# Patient Record
Sex: Female | Born: 1960
Health system: Southern US, Community
[De-identification: ages and names within clinical notes are randomized; demographics above are authoritative.]

## PROBLEM LIST (undated history)

## (undated) DIAGNOSIS — E785 Hyperlipidemia, unspecified: Secondary | ICD-10-CM

## (undated) DIAGNOSIS — T7840XA Allergy, unspecified, initial encounter: Secondary | ICD-10-CM

## (undated) DIAGNOSIS — D571 Sickle-cell disease without crisis: Secondary | ICD-10-CM

## (undated) DIAGNOSIS — I10 Essential (primary) hypertension: Secondary | ICD-10-CM

## (undated) DIAGNOSIS — N9069 Other specified hypertrophy of vulva: Secondary | ICD-10-CM

## (undated) DIAGNOSIS — D3910 Neoplasm of uncertain behavior of unspecified ovary: Secondary | ICD-10-CM

## (undated) DIAGNOSIS — F419 Anxiety disorder, unspecified: Secondary | ICD-10-CM

## (undated) HISTORY — DX: Anxiety disorder, unspecified: F41.9

## (undated) HISTORY — DX: Allergy, unspecified, initial encounter: T78.40XA

## (undated) HISTORY — DX: Sickle-cell disease without crisis: D57.1

## (undated) HISTORY — DX: Essential (primary) hypertension: I10

## (undated) HISTORY — DX: Hyperlipidemia, unspecified: E78.5

## (undated) HISTORY — DX: Neoplasm of uncertain behavior of unspecified ovary: D39.10

## (undated) HISTORY — DX: Other specified hypertrophy of vulva: N90.69

---

## 1998-11-04 ENCOUNTER — Other Ambulatory Visit: Admission: RE | Admit: 1998-11-04 | Discharge: 1998-11-04 | Payer: Self-pay | Admitting: Obstetrics and Gynecology

## 1999-11-15 ENCOUNTER — Other Ambulatory Visit: Admission: RE | Admit: 1999-11-15 | Discharge: 1999-11-15 | Payer: Self-pay | Admitting: Obstetrics and Gynecology

## 2000-01-24 ENCOUNTER — Encounter: Payer: Self-pay | Admitting: Internal Medicine

## 2000-01-24 ENCOUNTER — Ambulatory Visit (HOSPITAL_COMMUNITY): Admission: RE | Admit: 2000-01-24 | Discharge: 2000-01-24 | Payer: Self-pay | Admitting: Internal Medicine

## 2000-12-16 ENCOUNTER — Other Ambulatory Visit: Admission: RE | Admit: 2000-12-16 | Discharge: 2000-12-16 | Payer: Self-pay | Admitting: Obstetrics and Gynecology

## 2002-05-20 ENCOUNTER — Other Ambulatory Visit: Admission: RE | Admit: 2002-05-20 | Discharge: 2002-05-20 | Payer: Self-pay | Admitting: Obstetrics and Gynecology

## 2003-06-11 ENCOUNTER — Other Ambulatory Visit: Admission: RE | Admit: 2003-06-11 | Discharge: 2003-06-11 | Payer: Self-pay | Admitting: Obstetrics and Gynecology

## 2004-06-13 ENCOUNTER — Other Ambulatory Visit: Admission: RE | Admit: 2004-06-13 | Discharge: 2004-06-13 | Payer: Self-pay | Admitting: Obstetrics and Gynecology

## 2005-07-03 ENCOUNTER — Other Ambulatory Visit: Admission: RE | Admit: 2005-07-03 | Discharge: 2005-07-03 | Payer: Self-pay | Admitting: Obstetrics and Gynecology

## 2006-08-13 HISTORY — PX: FOOT SURGERY: SHX648

## 2006-09-12 ENCOUNTER — Other Ambulatory Visit: Admission: RE | Admit: 2006-09-12 | Discharge: 2006-09-12 | Payer: Self-pay | Admitting: Obstetrics and Gynecology

## 2007-10-27 ENCOUNTER — Other Ambulatory Visit: Admission: RE | Admit: 2007-10-27 | Discharge: 2007-10-27 | Payer: Self-pay | Admitting: Obstetrics and Gynecology

## 2009-08-16 ENCOUNTER — Ambulatory Visit (HOSPITAL_COMMUNITY): Admission: RE | Admit: 2009-08-16 | Discharge: 2009-08-16 | Payer: Self-pay | Admitting: Obstetrics and Gynecology

## 2009-08-16 DIAGNOSIS — D3910 Neoplasm of uncertain behavior of unspecified ovary: Secondary | ICD-10-CM

## 2009-08-16 HISTORY — PX: LAPAROSCOPIC HYSTERECTOMY: SHX1926

## 2009-08-16 HISTORY — PX: ABDOMINAL HYSTERECTOMY: SHX81

## 2009-08-16 HISTORY — DX: Neoplasm of uncertain behavior of unspecified ovary: D39.10

## 2009-09-14 ENCOUNTER — Ambulatory Visit: Admission: RE | Admit: 2009-09-14 | Discharge: 2009-09-14 | Payer: Self-pay | Admitting: Gynecologic Oncology

## 2010-09-20 ENCOUNTER — Other Ambulatory Visit: Payer: Self-pay | Admitting: Gynecologic Oncology

## 2010-09-20 ENCOUNTER — Ambulatory Visit: Payer: 59 | Attending: Gynecologic Oncology | Admitting: Gynecologic Oncology

## 2010-09-20 DIAGNOSIS — D3910 Neoplasm of uncertain behavior of unspecified ovary: Secondary | ICD-10-CM | POA: Insufficient documentation

## 2010-09-20 DIAGNOSIS — G893 Neoplasm related pain (acute) (chronic): Secondary | ICD-10-CM

## 2010-09-20 DIAGNOSIS — Z90710 Acquired absence of both cervix and uterus: Secondary | ICD-10-CM | POA: Insufficient documentation

## 2010-09-20 DIAGNOSIS — I10 Essential (primary) hypertension: Secondary | ICD-10-CM | POA: Insufficient documentation

## 2010-09-20 DIAGNOSIS — Z09 Encounter for follow-up examination after completed treatment for conditions other than malignant neoplasm: Secondary | ICD-10-CM | POA: Insufficient documentation

## 2010-09-28 ENCOUNTER — Other Ambulatory Visit: Payer: Self-pay | Admitting: Gynecologic Oncology

## 2010-09-28 ENCOUNTER — Ambulatory Visit (HOSPITAL_COMMUNITY)
Admission: RE | Admit: 2010-09-28 | Discharge: 2010-09-28 | Disposition: A | Discharge status: Home / Self Care | Payer: 59 | Source: Ambulatory Visit | Attending: Gynecologic Oncology | Admitting: Gynecologic Oncology

## 2010-09-28 DIAGNOSIS — G893 Neoplasm related pain (acute) (chronic): Secondary | ICD-10-CM

## 2010-09-28 DIAGNOSIS — N838 Other noninflammatory disorders of ovary, fallopian tube and broad ligament: Secondary | ICD-10-CM | POA: Insufficient documentation

## 2010-09-28 DIAGNOSIS — Z90710 Acquired absence of both cervix and uterus: Secondary | ICD-10-CM | POA: Insufficient documentation

## 2010-09-28 DIAGNOSIS — Z9079 Acquired absence of other genital organ(s): Secondary | ICD-10-CM | POA: Insufficient documentation

## 2010-10-29 LAB — COMPREHENSIVE METABOLIC PANEL
ALT: 16 U/L (ref 0–35)
AST: 24 U/L (ref 0–37)
Albumin: 3.7 g/dL (ref 3.5–5.2)
Alkaline Phosphatase: 67 U/L (ref 39–117)
BUN: 13 mg/dL (ref 6–23)
CO2: 28 mEq/L (ref 19–32)
Calcium: 9.2 mg/dL (ref 8.4–10.5)
Chloride: 105 mEq/L (ref 96–112)
Creatinine, Ser: 0.89 mg/dL (ref 0.4–1.2)
GFR calc Af Amer: >60 mL/min (ref >60)
GFR calc non Af Amer: >60 mL/min (ref >60)
Glucose, Bld: 102 mg/dL — ABNORMAL HIGH (ref 70–99)
Potassium: 3.7 mEq/L (ref 3.5–5.1)
Sodium: 138 mEq/L (ref 135–145)
Total Bilirubin: 0.7 mg/dL (ref 0.3–1.2)
Total Protein: 6.8 g/dL (ref 6.0–8.3)

## 2010-10-29 LAB — CBC
HCT: 37.5 % (ref 36.0–46.0)
Hemoglobin: 12.4 g/dL (ref 12.0–15.0)
MCHC: 33.1 g/dL (ref 30.0–36.0)
MCV: 89.6 fL (ref 78.0–100.0)
Platelets: 263 10*3/uL (ref 150–400)
RBC: 4.18 MIL/uL (ref 3.87–5.11)
RDW: 13.4 % (ref 11.5–15.5)
WBC: 5.4 10*3/uL (ref 4.0–10.5)

## 2010-10-29 LAB — HCG, SERUM, QUALITATIVE: Preg, Serum: NEGATIVE

## 2010-11-03 NOTE — Consult Note (Signed)
NAMEELEESHA, PURKEY                ACCOUNT NO.:  [id number]  MEDICAL RECORD NO.:  [id number]           PATIENT TYPE:  LOCATION:                                 FACILITY:  PHYSICIAN:  Ridhaan Dreibelbis A. 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  She underwent in January 2011 a robotic-assisted hysterectomy, right salpingo-oophorectomy, and lysis of adhesions.  In the right adnexa, she had a 3 cm cyst.  When the ovary was opened, chocolate fluid consistent with an endometrioma extruded from it.  Left ovary was otherwise unremarkable, though adherent to the left pelvic sidewall.  Pathology was consistent with a low malignant potential tumor of the ovary.  This pathology was reviewed by Dr. Maple Hudson at [facility].  I last saw her on September 14, 2009.  Discussion at that time surrounded removing the other ovary versus conservative followup.  She opted for conservative followup and our plan was that she would see Dr. Tresa Res in the summer time and have an ultrasound.  She states she did see Dr. Tresa Res and does not recall having an ultrasound.  She is overall doing quite well.  She has had hot flashes for the past 2 months.  She is taking black cohosh with significant benefit.  She had some night sweats, but those are completely gone again with the black cohosh.  She denies any pain, any change in bowel or bladder habits.  She does use FiberCon p.r.n. Occasionally, she will need to use MiraLax.  She denies any blood in her stool.  We had a discussion that regarding her desire to keep the ovary was surrounding the wish to not be menopausal, but now that she is menopausal, does she wish to have the left ovary removed, she states she does not and she would also like to  avoid surgery, but if there is any abnormality in the left ovary, she will be willing to reconsider.  She was told by Dr. Tresa Res in September that her cholesterol was elevated, she is not sure what the value was.  She has a history of high cholesterol and was on Lipitor in the past and came off it once her cholesterol normalized.  She was to follow up with her primary care physician, which she has not done.  PAST MEDICAL HISTORY: 1. Hypertension. 2. Asthma. 3. Allergies 4. Sickle cell trait. 5. Hypercholesterolemia.  MEDICATIONS: 1. Nariz. 2. Singulair. 3. Xyzal. 4. Advil. 5. Exforge. 6. Calcium. 7. Vitamin D. 8. Multivitamin. 9. Glucosamine. 10.Deplin. 11.Paxil.  ALLERGIES:  None.  HEALTH MAINTENANCE:  She is up-to-date on her mammogram.  She is due for a colonoscopy soon.  PHYSICAL EXAMINATION:  VITAL SIGNS:  Weight 121 pounds, height 5 feet 4 inches, blood pressure 156/90, respirations 16, pulse 68. GENERAL:  Well-nourished and well-developed female in no acute distress. NECK:  Supple.  There is no  lymphadenopathy.  No thyromegaly. LUNGS:  Clear to auscultation bilaterally. CARDIOVASCULAR:  Regular rate and rhythm. ABDOMEN:  Well-healed surgical incisions.  Abdomen is soft, nontender, nondistended.  There are no palpable masses or hepatosplenomegaly.  Exam is somewhat limited by voluntary guarding. LYMPHATIC:  Groins are negative for adenopathy. EXTREMITIES:  No edema. PELVIC:  External genitalia within normal limits.  Bimanual examination reveals no masses or nodularity. RECTAL:  Confirms.  ASSESSMENT AND PLAN: 1. A 50 year old with a stage IC low malignant potential tumor of the     ovary, who still has her left ovary in situ.  It does not appear     that she has had an ultrasound.  We will schedule her for an     ultrasound today to evaluate the left ovary, and I will contact her     with the results. 2. She was encouraged to see her primary care physician  regarding her     cholesterol level and need for screening colonoscopy. 3. She will see Dr. Tresa Res in 6 months and return to see [place] in 1 year     or p.r.n.     Hersh Minney A. Duard Brady, MD     PAG/MEDQ  D:  09/20/2010  T:  09/20/2010  Job:  [id number]  cc:   Margaretmary Bayley, M.D. Fax: [phone]  Telford Nab, R.N. [address] [place], [place] [address]  Edwena Felty. Romine, M.D. Fax: [phone]  Electronically Signed by Cleda Mccreedy MD on 09/22/2010 10:19:34 AM

## 2011-01-20 ENCOUNTER — Encounter: Payer: Self-pay | Admitting: Gastroenterology

## 2011-08-17 ENCOUNTER — Other Ambulatory Visit: Payer: Self-pay | Admitting: Internal Medicine

## 2011-08-17 ENCOUNTER — Ambulatory Visit (HOSPITAL_COMMUNITY)
Admission: RE | Admit: 2011-08-17 | Discharge: 2011-08-17 | Disposition: A | Discharge status: Home / Self Care | Payer: 59 | Source: Ambulatory Visit | Attending: Internal Medicine | Admitting: Internal Medicine

## 2011-08-17 DIAGNOSIS — M4120 Other idiopathic scoliosis, site unspecified: Secondary | ICD-10-CM | POA: Insufficient documentation

## 2011-08-17 DIAGNOSIS — R52 Pain, unspecified: Secondary | ICD-10-CM

## 2011-08-17 DIAGNOSIS — M545 Low back pain, unspecified: Secondary | ICD-10-CM | POA: Insufficient documentation

## 2011-08-17 DIAGNOSIS — M25559 Pain in unspecified hip: Secondary | ICD-10-CM | POA: Insufficient documentation

## 2011-09-06 ENCOUNTER — Encounter: Payer: Self-pay | Admitting: Gastroenterology

## 2011-09-10 ENCOUNTER — Ambulatory Visit (AMBULATORY_SURGERY_CENTER): Payer: 59 | Admitting: *Deleted

## 2011-09-10 VITALS — Ht 64.0 in | Wt 185.0 lb

## 2011-09-10 DIAGNOSIS — Z1211 Encounter for screening for malignant neoplasm of colon: Secondary | ICD-10-CM

## 2011-09-10 MED ORDER — PEG-KCL-NACL-NASULF-NA ASC-C 100 G PO SOLR: ORAL | Status: DC

## 2011-09-11 ENCOUNTER — Encounter: Payer: Self-pay | Admitting: Gastroenterology

## 2011-09-24 ENCOUNTER — Encounter: Payer: Self-pay | Admitting: Gastroenterology

## 2011-09-24 ENCOUNTER — Ambulatory Visit (AMBULATORY_SURGERY_CENTER): Payer: 59 | Admitting: Gastroenterology

## 2011-09-24 DIAGNOSIS — D128 Benign neoplasm of rectum: Secondary | ICD-10-CM

## 2011-09-24 DIAGNOSIS — D126 Benign neoplasm of colon, unspecified: Secondary | ICD-10-CM

## 2011-09-24 DIAGNOSIS — D129 Benign neoplasm of anus and anal canal: Secondary | ICD-10-CM

## 2011-09-24 DIAGNOSIS — K5730 Diverticulosis of large intestine without perforation or abscess without bleeding: Secondary | ICD-10-CM

## 2011-09-24 DIAGNOSIS — Z1211 Encounter for screening for malignant neoplasm of colon: Secondary | ICD-10-CM

## 2011-09-24 MED ORDER — SODIUM CHLORIDE 0.9 % IV SOLN: 500.0000 mL | INTRAVENOUS | Status: DC

## 2011-09-24 NOTE — Op Note (Signed)
[facility] [address] [place], [place]  [address]  COLONOSCOPY PROCEDURE REPORT  PATIENT:  Tammy King, Tammy King  MR#:  [id number] BIRTHDATE:  07-07-1961, 50 yrs. old  GENDER:  female ENDOSCOPIST:  Barbette Hair. Arlyce Dice, MD REF. BY:  Margaretmary Bayley, M.D. PROCEDURE DATE:  09/24/2011 PROCEDURE:  Colonoscopy with snare polypectomy ASA CLASS:  Class II INDICATIONS:  Routine Risk Screening MEDICATIONS:   MAC sedation, administered by CRNA propofol 250mg IV  DESCRIPTION OF PROCEDURE:   After the risks benefits and alternatives of the procedure were thoroughly explained, informed consent was obtained.  Digital rectal exam was performed and revealed no rectal masses.   The LB CF-[id number] endoscope was introduced through the anus and advanced to the cecum, which was identified by both the appendix and ileocecal valve, without limitations.  The quality of the prep was excellent, using MoviPrep.  The instrument was then slowly withdrawn as the colon was fully examined. <<PROCEDUREIMAGES>>  FINDINGS:  A sessile polyp was found. It was 4 mm in size. It was found 30 cm from the point of entry. Polyp was snared without cautery. Retrieval was successful (see image4). snare polyp Moderate diverticulosis was found in the sigmoid colon (see image3).  Scattered diverticula were found. descending to ascending colon  This was otherwise a normal examination of the colon (see image2 and image7).   Retroflexed views in the rectum revealed no abnormalities.    The time to cecum =  1) 4.75 minutes. The scope was then withdrawn in  1) 10.0  minutes from the cecum and the procedure completed. COMPLICATIONS:  None ENDOSCOPIC IMPRESSION: 1) 4 mm sessile polyp 2) Moderate diverticulosis in the sigmoid colon 3) Diverticula, scattered 4) Otherwise normal examination RECOMMENDATIONS: 1) If the polyp(s) removed today are proven to be adenomatous (pre-cancerous) polyps, you will need a repeat colonoscopy  in 5 years. Otherwise you should continue to follow colorectal cancer screening guidelines for "routine risk" patients with colonoscopy in 10 years. REPEAT EXAM:  You will receive a letter from Dr. Arlyce Dice in 1-2 weeks, after reviewing the final pathology, with followup recommendations.  ______________________________ Barbette Hair Arlyce Dice, MD  CC:  n. eSIGNED:   Barbette Hair. Tammy King at 09/24/2011 09:13 AM  Christoper Fabian, [phone]

## 2011-09-24 NOTE — Patient Instructions (Addendum)
Colon Polyps A polyp is extra tissue that grows inside your body. Colon polyps grow in the large intestine. The large intestine, also called the colon, is part of your digestive system. It is a long, hollow tube at the end of your digestive tract where your body makes and stores stool. Most polyps are not dangerous. They are benign. This means they are not cancerous. But over time, some types of polyps can turn into cancer. Polyps that are smaller than a pea are usually not harmful. But larger polyps could someday become or may already be cancerous. To be safe, doctors remove all polyps and test them.  WHO GETS POLYPS? Anyone can get polyps, but certain people are more likely than others. You may have a greater chance of getting polyps if:  You are over 50.   You have had polyps before.   Someone in your family has had polyps.   Someone in your family has had cancer of the large intestine.   Find out if someone in your family has had polyps. You may also be more likely to get polyps if you:   Eat a lot of fatty foods.   Smoke.   Drink alcohol.   Do not exercise.   Eat too much.  SYMPTOMS  Most small polyps do not cause symptoms. People often do not know they have one until their caregiver finds it during a regular checkup or while testing them for something else. Some people do have symptoms like these:  Bleeding from the anus. You might notice blood on your underwear or on toilet paper after you have had a bowel movement.   Constipation or diarrhea that lasts more than a week.   Blood in the stool. Blood can make stool look black or it can show up as red streaks in the stool.  If you have any of these symptoms, see your caregiver. HOW DOES THE DOCTOR TEST FOR POLYPS? The doctor can use four tests to check for polyps:  Digital rectal exam. The caregiver wears gloves and checks your rectum (the last part of the large intestine) to see if it feels normal. This test would find  polyps only in the rectum. Your caregiver may need to do one of the other tests listed below to find polyps higher up in the intestine.   Barium enema. The caregiver puts a liquid called barium into your rectum before taking x-rays of your large intestine. Barium makes your intestine look white in the pictures. Polyps are dark, so they are easy to see.   Sigmoidoscopy. With this test, the caregiver can see inside your large intestine. A thin flexible tube is placed into your rectum. The device is called a sigmoidoscope, which has a light and a tiny video camera in it. The caregiver uses the sigmoidoscope to look at the last third of your large intestine.   Colonoscopy. This test is like sigmoidoscopy, but the caregiver looks at all of the large intestine. It usually requires sedation. This is the most common method for finding and removing polyps.  TREATMENT   The caregiver will remove the polyp during sigmoidoscopy or colonoscopy. The polyp is then tested for cancer.   If you have had polyps, your caregiver may want you to get tested regularly in the future.  PREVENTION  There is not one sure way to prevent polyps. You might be able to lower your risk of getting them if you:  Eat more fruits and vegetables and less fatty   food.   Do not smoke.   Avoid alcohol.   Exercise every day.   Lose weight if you are overweight.   Eating more calcium and folate can also lower your risk of getting polyps. Some foods that are rich in calcium are milk, cheese, and broccoli. Some foods that are rich in folate are chickpeas, kidney beans, and spinach.   Aspirin might help prevent polyps. Studies are under way.  Document Released: 04/25/2004 Document Revised: 04/11/2011 Document Reviewed: 10/01/2007 Exit[employer] Patient Information 2012 [employer].  Diverticulosis Diverticulosis is a common condition that develops when small pouches (diverticula) form in the wall of the colon. The risk of  diverticulosis increases with age. It happens more often in people who eat a low-fiber diet. Most individuals with diverticulosis have no symptoms. Those individuals with symptoms usually experience abdominal pain, constipation, or loose stools (diarrhea). HOME CARE INSTRUCTIONS   Increase the amount of fiber in your diet as directed by your caregiver or dietician. This may reduce symptoms of diverticulosis.   Your caregiver may recommend taking a dietary fiber supplement.   Drink at least 6 to 8 glasses of water each day to prevent constipation.   Try not to strain when you have a bowel movement.   Your caregiver may recommend avoiding nuts and seeds to prevent complications, although this is still an uncertain benefit.   Only take over-the-counter or prescription medicines for pain, discomfort, or fever as directed by your caregiver.  FOODS WITH HIGH FIBER CONTENT INCLUDE:  Fruits. Apple, peach, pear, tangerine, raisins, prunes.   Vegetables. Brussels sprouts, asparagus, broccoli, cabbage, carrot, cauliflower, romaine lettuce, spinach, summer squash, tomato, winter squash, zucchini.   Starchy Vegetables. Baked beans, kidney beans, lima beans, split peas, lentils, potatoes (with skin).   Grains. Whole wheat bread, brown rice, bran flake cereal, plain oatmeal, white rice, shredded wheat, bran muffins.  SEEK IMMEDIATE MEDICAL CARE IF:   You develop increasing pain or severe bloating.   You have an oral temperature above 102 F (38.9 C), not controlled by medicine.   You develop vomiting or bowel movements that are bloody or black.  Document Released: 04/26/2004 Document Revised: 04/11/2011 Document Reviewed: 12/28/2009 Exit[employer] Patient Information 2012 [employer]. Discharge instructions given with verbal understanding. Handouts on polyps and diverticulosis given. Resume previous medications. 

## 2011-09-24 NOTE — Progress Notes (Signed)
proopfol per [facility]. See scanned intra procedure report. ewm

## 2011-09-24 NOTE — Progress Notes (Signed)
Patient did not experience any of the following events: a burn prior to discharge; a fall within the facility; wrong site/side/patient/procedure/implant event; or a hospital transfer or hospital admission upon discharge from the facility. (G8907) Patient did not have preoperative order for IV antibiotic SSI prophylaxis. (G8918)  

## 2011-09-25 ENCOUNTER — Telehealth: Payer: Self-pay | Admitting: *Deleted

## 2011-09-25 NOTE — Telephone Encounter (Signed)
  Follow up Call-  Call back number 09/24/2011  Post procedure Call Back phone  # [phone]  Permission to leave phone message Yes     Patient questions:  Do you have a fever, pain , or abdominal swelling? no Pain Score  0 *  Have you tolerated food without any problems? yes  Have you been able to return to your normal activities? yes  Do you have any questions about your discharge instructions: Diet   no Medications  no Follow up visit  no  Do you have questions or concerns about your Care? yes  Actions: * If pain score is 4 or above: No action needed, pain <4.  Pt. Did not have any questions or concerns about her care, "yes' entered in error.

## 2011-09-28 ENCOUNTER — Encounter: Payer: Self-pay | Admitting: Gastroenterology

## 2011-11-29 ENCOUNTER — Ambulatory Visit: Payer: 59 | Attending: Gynecologic Oncology | Admitting: Gynecologic Oncology

## 2011-11-29 ENCOUNTER — Encounter: Payer: Self-pay | Admitting: Gynecologic Oncology

## 2011-11-29 VITALS — BP 120/78 | HR 82 | Temp 98.5°F | Resp 20 | Ht 64.0 in | Wt 177.5 lb

## 2011-11-29 DIAGNOSIS — D279 Benign neoplasm of unspecified ovary: Secondary | ICD-10-CM

## 2011-11-29 DIAGNOSIS — C569 Malignant neoplasm of unspecified ovary: Secondary | ICD-10-CM

## 2011-11-29 DIAGNOSIS — D3910 Neoplasm of uncertain behavior of unspecified ovary: Secondary | ICD-10-CM | POA: Insufficient documentation

## 2011-11-29 NOTE — Progress Notes (Signed)
Consult Note: Gyn-Onc  Tammy King 51 y.o. female  CC:  Chief Complaint  Patient presents with  . 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 She underwent in January 2011 a robotic-assisted hysterectomy, right  salpingo-oophorectomy, and lysis of adhesions. In the right adnexa, she had a 3 cm cyst. When the ovary was opened, chocolate fluid consistent with an endometrioma extruded from it. Left ovary was otherwise  unremarkable, though adherent to the left pelvic sidewall. Pathology was consistent with a low malignant potential tumor of the ovary. This pathology was reviewed by Dr. Maple Hudson at [facility]. I last saw her on September 14, 2009. Discussion at that time surrounded removing the other ovary versus conservative followup. She opted for conservative followup.  She states she did see Dr. Tresa Res.  She is overall doing quite well. She has  had hot flashes. She had some night sweats. She denies any pain, any change in bowel or bladder habits. She does use FiberCon p.r.n. occasionally, she will need to use MiraLax. She denies any blood in her  stool.   We had a discussion that regarding her desire to keep the ovary was surrounding the wish to not be menopausal, but now that she is perimenopausal, does she wish to have the left ovary removed.  She is considering having her ovary removed, but has not completely decided.  Interval History:  She had a colonoscopy, February 11 that had a small polyp was benign. Recommendations were for 10 year followup. She had a mammogram in the fall of 2012. There was an issue questionable with the right breast followup was negative and followup for that is one year. There are no new family history issues. Review of Systems 10 point review of systems is negative Current Meds:  Outpatient Encounter Prescriptions as of 11/29/2011    Medication Sig Dispense Refill  . atorvastatin (LIPITOR) 20 MG tablet Take 1 tablet by mouth Daily.      . AZOR 10-40 MG per tablet Take 1 tablet by mouth Daily.      . CELEBREX 200 MG capsule Take 1 capsule by mouth as needed. pain      . Cholecalciferol (VITAMIN D) 1000 UNITS capsule Take 1,000 Units by mouth daily as needed. Takes 2-3 times a week      . Cyanocobalamin (VITAMIN B 12 PO) Take 100 mcg by mouth daily.      Marland Kitchen desloratadine (CLARINEX) 5 MG tablet Take 5 mg by mouth daily.      . hydrochlorothiazide (HYDRODIURIL) 25 MG tablet Take 1 tablet by mouth Daily.      Marland Kitchen KLOR-CON M20 20 MEQ tablet Take 1 tablet by mouth Daily.      . montelukast (SINGULAIR) 10 MG tablet Take 1 tablet by mouth Daily.      . Multiple Vitamin (MULITIVITAMIN WITH MINERALS) TABS Take 1 tablet by mouth daily as needed. Takes about 2-3 a week      . Nutritional Supplements (ESTROVEN MAXIMUM STRENGTH) TABS Take 1 tablet by mouth daily.      Marland Kitchen PARoxetine (PAXIL) 20 MG tablet Take 20 mg by mouth every morning.        Allergy: No Known Allergies  Social Hx:   History   Social History  . Marital Status: Single    Spouse Name: N/A    Number of  Children: N/A  . Years of Education: N/A   Occupational History  . Not on file.   Social History Main Topics  . Smoking status: Never Smoker   . Smokeless tobacco: Never Used  . Alcohol Use: 1.2 oz/week    2 Glasses of wine per week  . Drug Use: No  . Sexually Active: Not on file   Other Topics Concern  . Not on file   Social History Narrative  . No narrative on file    Past Surgical Hx:  Past Surgical History  Procedure Date  . Abdominal hysterectomy 2011    Past Medical Hx:  Past Medical History  Diagnosis Date  . Allergy   . Anxiety   . Asthma   . Hyperlipidemia   . Hypertension   . Sickle cell anemia     trait    Family Hx:  Family History  Problem Relation Age of Onset  . Colon cancer Maternal Grandmother     Vitals:  Blood  pressure 120/78, pulse 82, temperature 98.5 F (36.9 C), temperature source Oral, resp. rate 20, height 5\' 4"  (1.626 m), weight 177 lb 8 oz (80.513 kg).  Physical Exam: GENERAL: Well-nourished and well-developed female in no acute distress.  NECK: Supple. There is no lymphadenopathy. No thyromegaly.  LUNGS: Clear to auscultation bilaterally.  CARDIOVASCULAR: Regular rate and rhythm.  ABDOMEN: Well-healed surgical incisions. Abdomen is soft, nontender, nondistended. There are no palpable masses or hepatosplenomegaly. Exam is somewhat limited by voluntary guarding.  LYMPHATIC: Groins are negative for adenopathy.  EXTREMITIES: No edema.  PELVIC: External genitalia within normal limits. Bimanual examination reveals no masses or nodularity.  RECTAL: Confirms.   Assessment/Plan: ASSESSMENT AND PLAN:  51. A 51 year old with a stage IC low malignant potential tumor of the ovary, who still has her left ovary in situ. It does not appear that she has had an ultrasound. We will schedule her for an ultrasound today to evaluate the left ovary, and I will contact her with the results.  2.  She will see Dr. Tresa Res in 6 months and return to see [place] in 1 year or p.r.n.  Travis Mastel A., MD 11/29/2011, 10:35 AM

## 2011-11-29 NOTE — Patient Instructions (Signed)
Please keep your ultrasound appointment I will followup on the results with you. See Dr. Tresa Res in 6 months and return to see [place] in one year

## 2011-12-06 ENCOUNTER — Ambulatory Visit (HOSPITAL_COMMUNITY)
Admission: RE | Admit: 2011-12-06 | Discharge: 2011-12-06 | Disposition: A | Discharge status: Home / Self Care | Payer: 59 | Source: Ambulatory Visit | Attending: Gynecologic Oncology | Admitting: Gynecologic Oncology

## 2011-12-06 DIAGNOSIS — C569 Malignant neoplasm of unspecified ovary: Secondary | ICD-10-CM

## 2011-12-06 DIAGNOSIS — Z9079 Acquired absence of other genital organ(s): Secondary | ICD-10-CM | POA: Insufficient documentation

## 2011-12-06 DIAGNOSIS — Z90710 Acquired absence of both cervix and uterus: Secondary | ICD-10-CM | POA: Insufficient documentation

## 2011-12-06 DIAGNOSIS — Z09 Encounter for follow-up examination after completed treatment for conditions other than malignant neoplasm: Secondary | ICD-10-CM | POA: Insufficient documentation

## 2011-12-10 ENCOUNTER — Telehealth: Payer: Self-pay | Admitting: Gynecologic Oncology

## 2011-12-10 NOTE — Telephone Encounter (Signed)
Message left for patient with ultrasound results: normal.  Instructed to call for any questions or concerns.

## 2013-01-21 ENCOUNTER — Other Ambulatory Visit: Payer: Self-pay | Admitting: Gynecologic Oncology

## 2013-01-21 ENCOUNTER — Telehealth: Payer: Self-pay | Admitting: *Deleted

## 2013-01-21 DIAGNOSIS — D270 Benign neoplasm of right ovary: Secondary | ICD-10-CM

## 2013-01-21 NOTE — Telephone Encounter (Signed)
Called to notify pt of scheduled U/S appointment. PT instructed to arrive at [facility] @ 1:45pm on June 30th for a 2:00 appt with a full bladder. Pt agreed with appointment time and date.

## 2013-02-09 ENCOUNTER — Ambulatory Visit (HOSPITAL_COMMUNITY)
Admission: RE | Admit: 2013-02-09 | Discharge: 2013-02-09 | Disposition: A | Discharge status: Home / Self Care | Payer: 59 | Source: Ambulatory Visit | Attending: Gynecologic Oncology | Admitting: Gynecologic Oncology

## 2013-02-09 DIAGNOSIS — Z90710 Acquired absence of both cervix and uterus: Secondary | ICD-10-CM | POA: Insufficient documentation

## 2013-02-09 DIAGNOSIS — D270 Benign neoplasm of right ovary: Secondary | ICD-10-CM

## 2013-02-09 DIAGNOSIS — Z09 Encounter for follow-up examination after completed treatment for conditions other than malignant neoplasm: Secondary | ICD-10-CM | POA: Insufficient documentation

## 2013-02-09 DIAGNOSIS — Z9079 Acquired absence of other genital organ(s): Secondary | ICD-10-CM | POA: Insufficient documentation

## 2013-02-19 ENCOUNTER — Ambulatory Visit: Payer: 59 | Attending: Gynecologic Oncology | Admitting: Gynecologic Oncology

## 2013-02-19 ENCOUNTER — Encounter: Payer: Self-pay | Admitting: Gynecologic Oncology

## 2013-02-19 VITALS — BP 110/80 | HR 70 | Temp 98.6°F | Resp 16 | Ht 64.0 in | Wt 173.1 lb

## 2013-02-19 DIAGNOSIS — C569 Malignant neoplasm of unspecified ovary: Secondary | ICD-10-CM

## 2013-02-19 NOTE — Patient Instructions (Signed)
Doing well.  Plan to follow up with Dr. Tresa Res in six months and Dr. Duard Brady in one year.  Please call for any questions or concerns.

## 2013-02-20 ENCOUNTER — Encounter: Payer: Self-pay | Admitting: Gynecologic Oncology

## 2013-02-20 NOTE — Progress Notes (Signed)
Follow Up Note: Gyn-Onc  Tammy King 52 y.o. female  CC:  Chief Complaint  Patient presents with  . 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 She underwent in January 2011 a robotic-assisted hysterectomy, right salpingo-oophorectomy, and lysis of adhesions. In the right adnexa, she had a 3 cm cyst. When the ovary was opened, chocolate fluid consistent with an endometrioma extruded from it. Left ovary was otherwise unremarkable, though adherent to the left pelvic sidewall. Pathology was consistent with a low malignant potential tumor of the ovary. The pathology was reviewed by Dr. Maple Hudson at [facility].  Post-operatively, discussion surrounded removing the other ovary versus conservative followup. She opted for conservative followup.  She had an ultrasound on 02/09/13 that resulted: Stable pelvic ultrasound status post hysterectomy and right oophorectomy. No left adnexal mass.  Interval History:  She presents today for continued follow up.  She reports doing well since her last visit.  She has been alternating visits with Dr. Tresa Res.  She reports two episodes of minimal vaginal spotting post-coital.  She denies the use of lubrication at that time.  She denies abdominal pain, nausea, vomiting, or abdominal distention. No other concerns voiced.      Review of Systems  Constitutional: Feels well.  No weight loss/gain, early satiety, fever, or chills.  Cardiovascular: No chest pain, shortness of breath, or edema.  Pulmonary: No cough or wheeze.  Gastrointestinal: No nausea, vomiting, or diarrhea. No bright red blood per rectum or change in bowel movement.  No abdominal pain.  Genitourinary: No frequency, urgency, or dysuria. No discharge.  Mild vaginal spotting post coital.    Musculoskeletal: No myalgia or joint pain. Neurologic:  No weakness, numbness, or change in gait.  Psychology: No depression, anxiety, or insomnia.  Current Meds:  Outpatient Encounter Prescriptions as of 02/19/2013  Medication Sig Dispense Refill  . AZOR 10-40 MG per tablet Take 1 tablet by mouth Daily.      . Cyanocobalamin (VITAMIN B 12 PO) Take 100 mcg by mouth daily.      Marland Kitchen desloratadine (CLARINEX) 5 MG tablet Take 5 mg by mouth daily.      . hydrochlorothiazide (HYDRODIURIL) 25 MG tablet Take 1 tablet by mouth Daily.      Marland Kitchen KLOR-CON M20 20 MEQ tablet Take 1 tablet by mouth Daily.      . Multiple Vitamin (MULITIVITAMIN WITH MINERALS) TABS Take 1 tablet by mouth daily as needed. Takes about 2-3 a week      . Nutritional Supplements (ESTROVEN MAXIMUM STRENGTH) TABS Take 1 tablet by mouth daily.      Marland Kitchen PARoxetine (PAXIL) 20 MG tablet Take 20 mg by mouth every morning.      Marland Kitchen atorvastatin (LIPITOR) 20 MG tablet Take 1 tablet by mouth Daily.      . CELEBREX 200 MG capsule Take 1 capsule by mouth as needed. pain      . Cholecalciferol (VITAMIN D) 1000 UNITS capsule Take 1,000 Units by mouth daily as needed. Takes 2-3 times a week      . montelukast (SINGULAIR) 10 MG tablet Take 1 tablet by mouth Daily.       No facility-administered encounter medications on file as of 02/19/2013.    Allergy: No Known Allergies  Social Hx:   History   Social History  . Marital Status: Single    Spouse Name: N/A  Number of Children: N/A  . Years of Education: N/A   Occupational History  . Not on file.   Social History Main Topics  . Smoking status: Never Smoker   . Smokeless tobacco: Never Used  . Alcohol Use: 1.2 oz/week    2 Glasses of wine per week  . Drug Use: No  . Sexually Active: Not on file   Other Topics Concern  . Not on file   Social History Narrative  . No narrative on file    Past Surgical Hx:  Past Surgical History  Procedure Laterality Date  . Abdominal hysterectomy  2011    Past Medical Hx:  Past Medical History   Diagnosis Date  . Allergy   . Anxiety   . Asthma   . Hyperlipidemia   . Hypertension   . Sickle cell anemia     trait    Family Hx:  Family History  Problem Relation Age of Onset  . Colon cancer Maternal Grandmother     Vitals:  Blood pressure 110/80, pulse 70, temperature 98.6 F (37 C), temperature source Oral, resp. rate 16, height 5\' 4"  (1.626 m), weight 173 lb 1.6 oz (78.518 kg).  Physical Exam:  General: Well developed, well nourished female in no acute distress. Alert and oriented x 3.  Neck: Supple without any enlargements.  Lymph node survey: No cervical, supraclavicular, or inguinal adenopathy  Cardiovascular: Regular rate and rhythm. S1 and S2 normal.  Lungs: Clear to auscultation bilaterally. No wheezes/crackles/rhonchi noted.  Skin: No rashes or lesions present. Back: No CVA tenderness.  Abdomen: Abdomen soft, non-tender and obese. Active bowel sounds in all quadrants. No evidence of a fluid wave or abdominal masses.  Genitourinary:    Vulva/vagina: Normal external female genitalia. No lesions.    Urethra: No lesions or masses.    Vagina: Mildly atrophic without any lesions. No palpable masses. No vaginal bleeding or drainage noted.  Rectal: Good tone, no masses, no cul de sac nodularity.  Extremities: No bilateral cyanosis, edema, or clubbing.   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 She underwent in January 2011 a robotic-assisted hysterectomy, right salpingo-oophorectomy, and lysis of adhesions.  She is doing well.  Samples of vaginal lubricants given.  She is advised to follow up with Dr. Tresa Res in 6 months and Dr. Duard Brady in one year.  Reportable signs and symptoms reviewed including continued vaginal bleeding.  She is advised to call for any questions or concerns.  The patient was reviewed with Dr. Duard Brady, who agrees with the above plan.    Toyia Jelinek DEAL, NP 02/20/2013, 11:36  AM

## 2013-06-09 ENCOUNTER — Ambulatory Visit (INDEPENDENT_AMBULATORY_CARE_PROVIDER_SITE_OTHER): Payer: 59 | Admitting: Certified Nurse Midwife

## 2013-06-09 ENCOUNTER — Encounter: Payer: Self-pay | Admitting: Certified Nurse Midwife

## 2013-06-09 ENCOUNTER — Other Ambulatory Visit: Payer: Self-pay | Admitting: Obstetrics & Gynecology

## 2013-06-09 VITALS — BP 116/72 | HR 68 | Resp 16 | Ht 63.0 in | Wt 174.0 lb

## 2013-06-09 DIAGNOSIS — N390 Urinary tract infection, site not specified: Secondary | ICD-10-CM

## 2013-06-09 DIAGNOSIS — R350 Frequency of micturition: Secondary | ICD-10-CM

## 2013-06-09 LAB — POCT URINALYSIS DIPSTICK
Bilirubin, UA: NEGATIVE
Blood, UA: NEGATIVE
Glucose, UA: NEGATIVE
Nitrite, UA: NEGATIVE
Urobilinogen, UA: NEGATIVE

## 2013-06-09 NOTE — Progress Notes (Signed)
S:52 y.o.Single African American female presents with UTI  symptoms of urinary frequency. Onset of symptoms off and on for a month. Patient denies fever or chills, no back pain or pain with urination. Denies vaginal itching, or burning or odor or dryness. No STD concerns or testing needed. The patient is having no constitutional symptoms, denying fever, chills, anorexia, or weight loss.. Patient's boyfriend being treated for UTI and he thought "she caused it, so came in for check" Patient drinks caffeinated coffee all day long, very little water.   ROS: feels well  O alert, oriented to person, place, and time   healthy, well developed and well nourished  Skin: warm and dry  Negative CVAT bilateral  Abdomen: suprappubic negative  Pelvic Exam: External genital area:normal, no lesions  Urethra, urethral meatus, bladder, non tender  Vagina: normal appearance and discharge  Uterus/cervix absent  Adnexa:normal, non tender  Perineal area: no lesions       Poct urine- wbc trace  Assessment: Urinary frequency   Plan: Discussed findings and UTI symptoms may be related to excessive caffeine use. Instructed to increase water and decrease caffeine intake. Also discussed that UTI's are not contagious, but sexual activity can encourage occurrence. Lab Urine culture/micro Warning signs of UTI given and need to advise     RV prn, needs aex

## 2013-06-10 LAB — URINE CULTURE
Colony Count: NO GROWTH
Organism ID, Bacteria: NO GROWTH

## 2013-06-10 LAB — URINALYSIS, MICROSCOPIC ONLY: Squamous Epithelial / LPF: NONE SEEN

## 2013-06-11 ENCOUNTER — Ambulatory Visit (INDEPENDENT_AMBULATORY_CARE_PROVIDER_SITE_OTHER): Payer: 59 | Admitting: Certified Nurse Midwife

## 2013-06-11 ENCOUNTER — Encounter: Payer: Self-pay | Admitting: Certified Nurse Midwife

## 2013-06-11 VITALS — BP 140/80 | HR 88 | Resp 18 | Ht 63.0 in | Wt 175.0 lb

## 2013-06-11 DIAGNOSIS — Z01419 Encounter for gynecological examination (general) (routine) without abnormal findings: Secondary | ICD-10-CM

## 2013-06-11 NOTE — Patient Instructions (Signed)

## 2013-06-11 NOTE — Progress Notes (Signed)
52 y.o. G0P0000 Single African American Fe here for annual exam.  Menopausal no HRT. Denies vaginal dryness or vaginal bleeding. No partner change and no STD concerns or testing desired. Sees PCP yearly for aex and labs and medication management for hypertension. No health issues today.  Patient's last menstrual period was 08/16/2009.          Sexually active: yes  The current method of family planning is status post hysterectomy.    Exercising: yes  Cycling 1x/wk Smoker:  no  Health Maintenance: Pap:  04/13/11 Neg MMG:  10/14 Colonoscopy:  2014 BMD:   None  TDaP:  Unknown Labs: PCP    reports that she has never smoked. She has never used smokeless tobacco. She reports that she drinks about 2.4 ounces of alcohol per week. She reports that she does not use illicit drugs.  Past Medical History  Diagnosis Date  . Allergy   . Anxiety   . Asthma   . Hyperlipidemia   . Hypertension   . Sickle cell anemia     trait  . Squamous cell hyperplasia of vulva     Past Surgical History  Procedure Laterality Date  . Laparoscopic hysterectomy  08/16/09    TLH with RSO  . Foot surgery Left 2008    Current Outpatient Prescriptions  Medication Sig Dispense Refill  . amLODipine (NORVASC) 10 MG tablet       . AZOR 10-40 MG per tablet Take 1 tablet by mouth Daily.      . Cyanocobalamin (VITAMIN B 12 PO) Take 100 mcg by mouth daily.      Marland Kitchen desloratadine (CLARINEX) 5 MG tablet Take 5 mg by mouth daily.      . hydrochlorothiazide (HYDRODIURIL) 25 MG tablet Take 1 tablet by mouth Daily.      Marland Kitchen KLOR-CON M20 20 MEQ tablet Take 1 tablet by mouth Daily.      Marland Kitchen losartan (COZAAR) 100 MG tablet       . Multiple Vitamin (MULITIVITAMIN WITH MINERALS) TABS Take 1 tablet by mouth daily as needed. Takes about 2-3 a week      . Nutritional Supplements (ESTROVEN MAXIMUM STRENGTH) TABS Take 1 tablet by mouth daily.      Marland Kitchen PARoxetine (PAXIL) 20 MG tablet Take 20 mg by mouth every morning.       No current  facility-administered medications for this visit.    Family History  Problem Relation Age of Onset  . Hypertension Maternal Grandmother   . Diabetes Mother   . Diabetes Father   . Stroke Father   . Hypertension Maternal Grandfather   . Colon cancer Paternal Grandmother   . Hypertension Paternal Grandmother   . Heart attack Paternal Grandmother   . Hypertension Paternal Grandfather     ROS:  Pertinent items are noted in HPI.  Otherwise, a comprehensive ROS was negative.  Exam:   BP 140/80  Pulse 88  Resp 18  Ht 5\' 3"  (1.6 m)  Wt 175 lb (79.379 kg)  BMI 31.01 kg/m2  LMP 08/16/2009 Height: 5\' 3"  (160 cm)  Ht Readings from Last 3 Encounters:  06/11/13 5\' 3"  (1.6 m)  06/09/13 5\' 3"  (1.6 m)  02/19/13 5\' 4"  (1.626 m)    General appearance: alert, cooperative and appears stated age Head: Normocephalic, without obvious abnormality, atraumatic Neck: no adenopathy, supple, symmetrical, trachea midline and thyroid normal to inspection and palpation Lungs: clear to auscultation bilaterally Breasts: normal appearance, no masses or tenderness, No nipple retraction  or dimpling, No nipple discharge or bleeding, No axillary or supraclavicular adenopathy Heart: regular rate and rhythm Abdomen: soft, non-tender; no masses,  no organomegaly Extremities: extremities normal, atraumatic, no cyanosis or edema Skin: Skin color, texture, turgor normal. No rashes or lesions Lymph nodes: Cervical, supraclavicular, and axillary nodes normal. No abnormal inguinal nodes palpated Neurologic: Grossly normal   Pelvic: External genitalia:  no lesions              Urethra:  normal appearing urethra with no masses, tenderness or lesions              Bartholin's and Skene's: normal                 Vagina: normal appearing vagina with normal color and discharge, no lesions              Cervix: normal, non tender              Pap taken: no Bimanual Exam:  Uterus:  uterus absent              Adnexa:  normal adnexa, no mass, fullness, tenderness and positive for: right adnexa absent.               Rectovaginal: Confirms               Anus:  normal sphincter tone, no lesions  A:  Well Woman with normal exam  Menopausal no HRT S/P TVH, RSO due to menorrhagia  Hypertension stable medication with PCP management  P:   Reviewed health and wellness pertinent to exam  Continue follow up  As indicated  Pap smear as per guidelines   Mammogram yearly pap smear not taken today  counseled on breast self exam, mammography screening, adequate intake of calcium and vitamin D, diet and exercise  return annually or prn  An After Visit Summary was printed and given to the patient.

## 2013-06-11 NOTE — Progress Notes (Signed)
Note reviewed, agree with plan.  Ahmoni Edge, MD  

## 2013-06-12 ENCOUNTER — Telehealth: Payer: Self-pay

## 2013-06-12 NOTE — Telephone Encounter (Signed)
Patient notified of results.

## 2013-06-12 NOTE — Progress Notes (Signed)
Note reviewed, agree with plan.  Corrie Brannen, MD  

## 2013-06-12 NOTE — Telephone Encounter (Signed)
lmtcb

## 2013-06-12 NOTE — Telephone Encounter (Signed)
Message copied by Eliezer Bottom on Fri Jun 12, 2013 11:41 AM ------      Message from: Verner Chol      Created: Thu Jun 11, 2013  8:17 AM       Notify urine micro and culture negative ------

## 2013-09-09 ENCOUNTER — Other Ambulatory Visit: Payer: Self-pay | Admitting: Obstetrics & Gynecology

## 2013-09-10 ENCOUNTER — Other Ambulatory Visit: Payer: Self-pay | Admitting: *Deleted

## 2013-09-10 NOTE — Telephone Encounter (Signed)
Last AEX 06/11/2013 Last refill 05/22/2012 #30/12 refills No future appt.

## 2013-09-18 ENCOUNTER — Ambulatory Visit (INDEPENDENT_AMBULATORY_CARE_PROVIDER_SITE_OTHER): Payer: 59 | Admitting: Internal Medicine

## 2013-09-18 DIAGNOSIS — Z7184 Encounter for health counseling related to travel: Secondary | ICD-10-CM

## 2013-09-18 DIAGNOSIS — Z23 Encounter for immunization: Secondary | ICD-10-CM

## 2013-09-18 DIAGNOSIS — Z7189 Other specified counseling: Secondary | ICD-10-CM

## 2013-09-18 MED ORDER — CIPROFLOXACIN HCL 500 MG PO TABS: 500.0000 mg | ORAL_TABLET | Freq: Two times a day (BID) | ORAL | Status: DC

## 2013-09-19 DIAGNOSIS — Z7184 Encounter for health counseling related to travel: Secondary | ICD-10-CM | POA: Insufficient documentation

## 2013-09-19 NOTE — Progress Notes (Signed)
  Subjective:    Tammy King is a 53 y.o. female who presents to the Infectious Disease clinic for travel consultation. 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 none      Plan:    Issues discussed: environmental concerns, future shots, insect-borne illnesses, malaria, motion sickness, MVA safety, rabies, safe food/water, traveler's diarrhea, website/handouts for more information, what to do if ill upon return, what to do if ill while there and Yellow Fever. Immunizations recommended: Hepatitis A series, Td and Typhoid (parenteral). Malaria prophylaxis: Not indicated for her itinerary, I did offer and patient is not concerned Traveler's diarrhea prophylaxis: ciprofloxacin.

## 2014-01-16 ENCOUNTER — Other Ambulatory Visit: Payer: Self-pay | Admitting: Certified Nurse Midwife

## 2014-01-18 NOTE — Telephone Encounter (Signed)
Last AEX 06/11/13 Last refill 09/09/13 #30/ 1 refill No future appt scheduled.  Please approve or deny Rx.

## 2014-01-18 NOTE — Telephone Encounter (Signed)
Need her chart if possible

## 2014-01-19 ENCOUNTER — Telehealth: Payer: Self-pay | Admitting: Obstetrics & Gynecology

## 2014-01-19 NOTE — Telephone Encounter (Signed)
Chart in your door.

## 2014-02-23 ENCOUNTER — Telehealth: Payer: Self-pay | Admitting: *Deleted

## 2014-02-23 ENCOUNTER — Other Ambulatory Visit: Payer: Self-pay | Admitting: *Deleted

## 2014-02-23 DIAGNOSIS — D270 Benign neoplasm of right ovary: Secondary | ICD-10-CM

## 2014-02-23 NOTE — Telephone Encounter (Signed)
Called pt to inform of [place] appt scheduled on 7/20 at 230pm. Pt to arrive at 215 with full bladder, drink 32oz of water prior to [place]. Pt will have f.u with Dr. Denman George on July 27 to review results.

## 2014-03-01 ENCOUNTER — Ambulatory Visit (HOSPITAL_COMMUNITY): Payer: 59

## 2014-03-08 ENCOUNTER — Ambulatory Visit: Payer: 59 | Attending: Gynecologic Oncology | Admitting: Gynecologic Oncology

## 2014-03-12 ENCOUNTER — Ambulatory Visit (HOSPITAL_COMMUNITY)
Admission: RE | Admit: 2014-03-12 | Discharge: 2014-03-12 | Disposition: A | Discharge status: Home / Self Care | Payer: 59 | Source: Ambulatory Visit | Attending: Gynecologic Oncology | Admitting: Gynecologic Oncology

## 2014-03-12 DIAGNOSIS — D270 Benign neoplasm of right ovary: Secondary | ICD-10-CM

## 2014-03-12 DIAGNOSIS — Z8543 Personal history of malignant neoplasm of ovary: Secondary | ICD-10-CM | POA: Insufficient documentation

## 2014-03-19 ENCOUNTER — Ambulatory Visit: Payer: 59 | Attending: Gynecologic Oncology | Admitting: Gynecologic Oncology

## 2014-03-19 ENCOUNTER — Encounter: Payer: Self-pay | Admitting: Gynecologic Oncology

## 2014-03-19 DIAGNOSIS — I10 Essential (primary) hypertension: Secondary | ICD-10-CM | POA: Diagnosis not present

## 2014-03-19 DIAGNOSIS — Z79899 Other long term (current) drug therapy: Secondary | ICD-10-CM | POA: Diagnosis not present

## 2014-03-19 DIAGNOSIS — C569 Malignant neoplasm of unspecified ovary: Secondary | ICD-10-CM | POA: Diagnosis not present

## 2014-03-19 DIAGNOSIS — Z90710 Acquired absence of both cervix and uterus: Secondary | ICD-10-CM | POA: Diagnosis not present

## 2014-03-19 DIAGNOSIS — F411 Generalized anxiety disorder: Secondary | ICD-10-CM | POA: Diagnosis not present

## 2014-03-19 DIAGNOSIS — J45909 Unspecified asthma, uncomplicated: Secondary | ICD-10-CM | POA: Diagnosis not present

## 2014-03-19 DIAGNOSIS — E785 Hyperlipidemia, unspecified: Secondary | ICD-10-CM | POA: Insufficient documentation

## 2014-03-19 DIAGNOSIS — D4959 Neoplasm of unspecified behavior of other genitourinary organ: Secondary | ICD-10-CM

## 2014-03-19 DIAGNOSIS — D3910 Neoplasm of uncertain behavior of unspecified ovary: Secondary | ICD-10-CM

## 2014-03-19 NOTE — Progress Notes (Signed)
Follow Up Note: Gyn-Onc  Emilyann C Winning 53 y.o. female with a history of LMP right ovary, clinical stage IC.  CC:  Chief Complaint  Patient presents with  . 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 She underwent in January 2011 a robotic-assisted hysterectomy, right salpingo-oophorectomy, and lysis of adhesions. In the right adnexa, she had a 3 cm cyst. When the ovary was opened, chocolate fluid consistent with an endometrioma extruded from it. Left ovary was otherwise unremarkable, though adherent to the left pelvic sidewall. Pathology was consistent with a low malignant potential tumor of the ovary. The pathology was reviewed by Dr. Annamaria Boots at [facility].  Post-operatively, discussion surrounded removing the other ovary versus conservative followup. She opted for conservative followup.  Her most recent ultrasound on 07/31/2015resulted: Stable and normal appearance of left ovary (2.9x1.2x2.6cm) with no free fluid or abnormalities.  Interval History:  She presents today for continued follow up.  She reports doing well since her last visit.  She has been alternating visits with Dr. Charlies Constable. She is eager to discontinue the 6 monthly evaluations and begin annual surveillance. No other concerns voiced.    She denies symptoms concerning for recurrence including: vaginal bleeding, pelvic pain or pressure, abdominal pain, change in bladder or bowel habit, new lower extremity edema, cough, early satiety, nausea or emesis).  Review of Systems  Constitutional: Feels well.  No weight loss/gain, early satiety, fever, or chills.  Cardiovascular: No chest pain, shortness of breath, or edema.  Pulmonary: No cough or wheeze.  Gastrointestinal: No nausea, vomiting, or diarrhea. No bright red blood per rectum or change in bowel  movement.  No abdominal pain.  Genitourinary: No frequency, urgency, or dysuria. No discharge.  Musculoskeletal: No myalgia or joint pain. Neurologic: No weakness, numbness, or change in gait.  Psychology: No depression, anxiety, or insomnia.  Current Meds:  Outpatient Encounter Prescriptions as of 03/19/2014  Medication Sig  . amLODipine (NORVASC) 10 MG tablet   . atorvastatin (LIPITOR) 20 MG tablet   . AZOR 10-40 MG per tablet Take 1 tablet by mouth Daily.  . Cyanocobalamin (VITAMIN B 12 PO) Take 100 mcg by mouth daily.  . hydrochlorothiazide (HYDRODIURIL) 25 MG tablet Take 1 tablet by mouth Daily.  Marland Kitchen KLOR-CON M20 20 MEQ tablet Take 1 tablet by mouth Daily.  Marland Kitchen levocetirizine (XYZAL) 5 MG tablet   . losartan (COZAAR) 100 MG tablet   . Multiple Vitamin (MULITIVITAMIN WITH MINERALS) TABS Take 1 tablet by mouth daily as needed. Takes about 2-3 a week  . Nutritional Supplements (ESTROVEN MAXIMUM STRENGTH) TABS Take 1 tablet by mouth daily.  Marland Kitchen PARoxetine (PAXIL) 20 MG tablet take 1 tablet by mouth once daily  . RESTASIS 0.05 % ophthalmic emulsion   . [DISCONTINUED] ciprofloxacin (CIPRO) 500 MG tablet Take 1 tablet (500 mg total) by mouth 2 (two) times daily. Take 2-3 days until diarrhea resolves  . [DISCONTINUED] desloratadine (CLARINEX) 5 MG tablet Take 5 mg by mouth daily.    Allergy: No Known Allergies  Social Hx:   History   Social History  . Marital Status: Single    Spouse Name: N/A    Number of Children: N/A  . Years of Education: N/A   Occupational History  . Not on file.   Social History Main Topics  . Smoking status: Never Smoker   . Smokeless tobacco: Never Used  .  Alcohol Use: 2.4 oz/week    4 Glasses of wine per week  . Drug Use: No  . Sexual Activity: Yes    Partners: Male    Birth Control/ Protection: Surgical     Comment: hysterectomy   Other Topics Concern  . Not on file   Social History Narrative  . No narrative on file    Past Surgical Hx:  Past  Surgical History  Procedure Laterality Date  . Laparoscopic hysterectomy  08/16/09    TLH with RSO  . Foot surgery Left 2008  . Abdominal hysterectomy  08/16/2009    RSO    Past Medical Hx:  Past Medical History  Diagnosis Date  . Allergy   . Anxiety   . Asthma   . Hyperlipidemia   . Hypertension   . Sickle cell anemia     trait  . Squamous cell hyperplasia of vulva   . Ovarian low malignant potential tumor 08/16/2009    Family Hx:  Family History  Problem Relation Age of Onset  . Hypertension Maternal Grandmother   . Diabetes Mother   . Diabetes Father   . Stroke Father   . Hypertension Maternal Grandfather   . Colon cancer Paternal Grandmother   . Hypertension Paternal Grandmother   . Heart attack Paternal Grandmother   . Hypertension Paternal Grandfather     Vitals:  Last menstrual period 08/16/2009.  Physical Exam:  General: Well developed, well nourished female in no acute distress. Alert and oriented x 3.  Neck: Supple without any enlargements.  Lymph node survey: No cervical, supraclavicular, or inguinal adenopathy  Cardiovascular: Regular rate and rhythm. S1 and S2 normal.  Lungs: Clear to auscultation bilaterally. No wheezes/crackles/rhonchi noted.  Skin: No rashes or lesions present. Back: No CVA tenderness.  Abdomen: Abdomen soft, non-tender and obese. Active bowel sounds in all quadrants. No evidence of a fluid wave or abdominal masses.  Genitourinary:    Vulva/vagina: Normal external female genitalia. No lesions.    Urethra: No lesions or masses.    Vagina: Mildly atrophic without any lesions. No palpable masses. No vaginal bleeding or drainage noted.  Rectal: Good tone, no masses, no cul de sac nodularity.  Extremities: No bilateral cyanosis, edema, or clubbing.   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 She underwent in January 2011 a robotic-assisted hysterectomy, right  salpingo-oophorectomy, and lysis of adhesions.  She is doing well. Dr Charlies Constable or Melvia Heaps in 6 months and then annually thereafter.  Reportable signs and symptoms reviewed including continued vaginal bleeding.  She is advised to call for any questions or concerns. I discussed with Makalia that LMP tumors have a natural history of recurring remote from initial diagnosis, and so if new symptoms arise that are concerning, she should present for evaluation sooner.   Donaciano Eva, NP 03/19/2014, 2:43 PM

## 2014-03-19 NOTE — Patient Instructions (Signed)
Follow up with Dr. Charlies Constable.

## 2014-07-02 ENCOUNTER — Other Ambulatory Visit: Payer: Self-pay | Admitting: Certified Nurse Midwife

## 2014-07-02 NOTE — Telephone Encounter (Signed)
LMTCB concerning AEX and refill request

## 2014-07-02 NOTE — Telephone Encounter (Signed)
Her last AEX with Ms. Jackelyn Poling was 05/2013 needs a repeat AEX scheduled.  Then she can have enough meds until AEX.

## 2014-07-02 NOTE — Telephone Encounter (Signed)
Incoming Refill Request from [facility]:WAQLR 20mg   Last AEX:06/11/13 Last Refill:01/19/14 #30 X 2 Next AEX:NS  (Ms. Manon Hilding pt)  Please Advise

## 2014-07-05 NOTE — Telephone Encounter (Signed)
S/W pt. AEX is scheduled for 08/23/14 @ 3:30 with Ms. Debbi. Pt states she takes one every other day. 89mth sent in to pharmacy.  Encounter closed

## 2014-08-02 ENCOUNTER — Other Ambulatory Visit: Payer: Self-pay | Admitting: Nurse Practitioner

## 2014-08-03 ENCOUNTER — Other Ambulatory Visit: Payer: Self-pay | Admitting: Nurse Practitioner

## 2014-08-03 NOTE — Telephone Encounter (Deleted)
PATIENT Tammy King

## 2014-08-03 NOTE — Telephone Encounter (Signed)
Patient is asking for status of refill request. Patent needs refill today if possible.

## 2014-08-03 NOTE — Telephone Encounter (Signed)
Medication refill request: Paxil Last AEX:  06/11/13 Next AEX: 08/23/14 Last MMG (if hormonal medication request): 05/20/14 [id number]:neg Refill authorized: Last refill 07/05/14 #30/0R.

## 2014-08-04 NOTE — Telephone Encounter (Signed)
Patient needs phone call as to who has been managing Paxil, not just refills. My note says PCP from 2014

## 2014-08-04 NOTE — Telephone Encounter (Signed)
Patient needs phone call as to how often she is taking medication. Notes indicate every other day, so she should have enough until her exam

## 2014-08-04 NOTE — Telephone Encounter (Signed)
Status: Signed       Expand All Collapse All   S/W pt. AEX is scheduled for 08/23/14 @ 3:30 with Ms. Debbi. Pt states she takes one every other day. 29mth sent in to pharmacy.  Encounter closed            Denyse Dago, CMA at 07/02/2014 2:06 PM     Status: Signed       Expand All Collapse All   LMTCB concerning AEX and refill request             Milford Cage, FNP at 07/02/2014 1:10 PM     Status: Signed       Expand All Collapse All   Her last AEX with Ms. Jackelyn Poling was 05/2013 needs a repeat AEX scheduled. Then she can have enough meds until AEX.            Denyse Dago, CMA at 07/02/2014 9:00 AM     Status: Signed       Expand All Collapse All   Incoming Refill Request from [facility]:[id number] 20mg   Last AEX:06/11/13 Last Refill:01/19/14 #30 X 2 Next AEX:NS  (Ms. Manon Hilding pt)  Please Advise

## 2014-08-09 NOTE — Telephone Encounter (Signed)
See telephone encounter from 08/02/14  Routed to provider for review, encounter closed.

## 2014-08-10 NOTE — Telephone Encounter (Signed)
Paxil 20 mg #30/0 rfs sent to [facility], s/w associate and they do have the rx.

## 2014-08-10 NOTE — Telephone Encounter (Signed)
[facility] pharmacy calling to get a refill for paxil

## 2014-08-10 NOTE — Telephone Encounter (Signed)
Medication refill request: Paxil 20 mg  Last AEX: 06/11/13 Ms. Tammy King Next AEX: 08/23/14 Ms. Tammy King  Last MMG (if hormonal medication request): N/A Refill authorized: Please advise

## 2014-08-19 ENCOUNTER — Telehealth: Payer: Self-pay | Admitting: Certified Nurse Midwife

## 2014-08-19 NOTE — Telephone Encounter (Signed)
Left message on voicemail to reschedule aex appointment.

## 2014-08-23 ENCOUNTER — Ambulatory Visit: Payer: Self-pay | Admitting: Certified Nurse Midwife

## 2014-09-21 DIAGNOSIS — Z0289 Encounter for other administrative examinations: Secondary | ICD-10-CM

## 2015-01-11 ENCOUNTER — Ambulatory Visit (INDEPENDENT_AMBULATORY_CARE_PROVIDER_SITE_OTHER): Payer: 59

## 2015-01-11 ENCOUNTER — Ambulatory Visit (INDEPENDENT_AMBULATORY_CARE_PROVIDER_SITE_OTHER): Payer: 59 | Admitting: Family Medicine

## 2015-01-11 VITALS — BP 124/82 | HR 63 | Temp 98.8°F | Resp 18 | Ht 63.25 in | Wt 175.4 lb

## 2015-01-11 DIAGNOSIS — M25542 Pain in joints of left hand: Secondary | ICD-10-CM

## 2015-01-11 DIAGNOSIS — M7989 Other specified soft tissue disorders: Secondary | ICD-10-CM

## 2015-01-11 DIAGNOSIS — W19XXXA Unspecified fall, initial encounter: Secondary | ICD-10-CM

## 2015-01-11 DIAGNOSIS — M79645 Pain in left finger(s): Secondary | ICD-10-CM | POA: Diagnosis not present

## 2015-01-11 NOTE — Progress Notes (Signed)
  Subjective:  Patient ID: Tammy King, female    DOB: Sep 17, 1960  Age: 54 y.o. MRN: [id number]  9 days ago the patient was walking her dog and slipped in some water, falling and injuring her left thumb. It was a fall with outstretched arm type of a injury. She has been hurting in the thumb since then. It persisted staying swollen and hurting in the proximal joint. She was trying to lift something and hurt it more this weekend, also getting a little wound on her left middle fingertip. Works as an Forensic psychologist   Objective:   No major distress. Tender in her proximal joint of the left thumb. There is visible swelling when comparing it to the opposite thumb. Radial pulses good. Motion of the distal joint is fine. Not tender at the anatomical snuffbox or along the base of the thumb, just in that one joint area. The third finger has a wound a little less than a centimeter in diameter that has almost a blood blister appearance to it. It looks like it is healing already.  [facility] reading (PRIMARY) by  Dr. Linna Darner Normal thumb.    Assessment & Plan:   Assessment: Wound proximal joint left thumb with thumb swelling Wound left third fingertip  Plan:  Will treat by resting the thumb. If it is not improving she is to return. She is left-handed. Patient Instructions  Aleve 2 pills twice daily as needed for inflammation  With thumb spica splint to support the thumb and rested for a week or so  Return if not improving over the next couple of weeks       Wilton Thrall, MD 01/11/2015

## 2015-01-11 NOTE — Patient Instructions (Signed)
Aleve 2 pills twice daily as needed for inflammation  With thumb spica splint to support the thumb and rested for a week or so  Return if not improving over the next couple of weeks

## 2015-01-12 ENCOUNTER — Encounter: Payer: Self-pay | Admitting: Family Medicine

## 2015-03-04 ENCOUNTER — Encounter: Payer: Self-pay | Admitting: Gastroenterology

## 2016-05-23 ENCOUNTER — Encounter: Payer: Self-pay | Admitting: Family Medicine

## 2016-08-15 DIAGNOSIS — J3089 Other allergic rhinitis: Secondary | ICD-10-CM | POA: Diagnosis not present

## 2016-08-15 DIAGNOSIS — J3081 Allergic rhinitis due to animal (cat) (dog) hair and dander: Secondary | ICD-10-CM | POA: Diagnosis not present

## 2016-08-15 DIAGNOSIS — J301 Allergic rhinitis due to pollen: Secondary | ICD-10-CM | POA: Diagnosis not present

## 2016-08-22 DIAGNOSIS — J3089 Other allergic rhinitis: Secondary | ICD-10-CM | POA: Diagnosis not present

## 2016-08-22 DIAGNOSIS — J301 Allergic rhinitis due to pollen: Secondary | ICD-10-CM | POA: Diagnosis not present

## 2016-08-22 DIAGNOSIS — J3081 Allergic rhinitis due to animal (cat) (dog) hair and dander: Secondary | ICD-10-CM | POA: Diagnosis not present

## 2016-09-03 DIAGNOSIS — J3081 Allergic rhinitis due to animal (cat) (dog) hair and dander: Secondary | ICD-10-CM | POA: Diagnosis not present

## 2016-09-03 DIAGNOSIS — J3089 Other allergic rhinitis: Secondary | ICD-10-CM | POA: Diagnosis not present

## 2016-09-03 DIAGNOSIS — J301 Allergic rhinitis due to pollen: Secondary | ICD-10-CM | POA: Diagnosis not present

## 2016-09-12 DIAGNOSIS — J3089 Other allergic rhinitis: Secondary | ICD-10-CM | POA: Diagnosis not present

## 2016-09-12 DIAGNOSIS — J3081 Allergic rhinitis due to animal (cat) (dog) hair and dander: Secondary | ICD-10-CM | POA: Diagnosis not present

## 2016-09-12 DIAGNOSIS — J301 Allergic rhinitis due to pollen: Secondary | ICD-10-CM | POA: Diagnosis not present

## 2016-09-19 DIAGNOSIS — J3081 Allergic rhinitis due to animal (cat) (dog) hair and dander: Secondary | ICD-10-CM | POA: Diagnosis not present

## 2016-09-19 DIAGNOSIS — J3089 Other allergic rhinitis: Secondary | ICD-10-CM | POA: Diagnosis not present

## 2016-09-19 DIAGNOSIS — J301 Allergic rhinitis due to pollen: Secondary | ICD-10-CM | POA: Diagnosis not present

## 2016-09-26 DIAGNOSIS — J3081 Allergic rhinitis due to animal (cat) (dog) hair and dander: Secondary | ICD-10-CM | POA: Diagnosis not present

## 2016-09-26 DIAGNOSIS — J301 Allergic rhinitis due to pollen: Secondary | ICD-10-CM | POA: Diagnosis not present

## 2016-09-26 DIAGNOSIS — J3089 Other allergic rhinitis: Secondary | ICD-10-CM | POA: Diagnosis not present

## 2016-10-03 DIAGNOSIS — J3081 Allergic rhinitis due to animal (cat) (dog) hair and dander: Secondary | ICD-10-CM | POA: Diagnosis not present

## 2016-10-03 DIAGNOSIS — J301 Allergic rhinitis due to pollen: Secondary | ICD-10-CM | POA: Diagnosis not present

## 2016-10-03 DIAGNOSIS — J3089 Other allergic rhinitis: Secondary | ICD-10-CM | POA: Diagnosis not present

## 2016-10-09 DIAGNOSIS — J3081 Allergic rhinitis due to animal (cat) (dog) hair and dander: Secondary | ICD-10-CM | POA: Diagnosis not present

## 2016-10-09 DIAGNOSIS — J3089 Other allergic rhinitis: Secondary | ICD-10-CM | POA: Diagnosis not present

## 2016-10-09 DIAGNOSIS — J301 Allergic rhinitis due to pollen: Secondary | ICD-10-CM | POA: Diagnosis not present

## 2016-10-17 DIAGNOSIS — J3089 Other allergic rhinitis: Secondary | ICD-10-CM | POA: Diagnosis not present

## 2016-10-17 DIAGNOSIS — J3081 Allergic rhinitis due to animal (cat) (dog) hair and dander: Secondary | ICD-10-CM | POA: Diagnosis not present

## 2016-10-17 DIAGNOSIS — J301 Allergic rhinitis due to pollen: Secondary | ICD-10-CM | POA: Diagnosis not present

## 2016-10-24 DIAGNOSIS — J3089 Other allergic rhinitis: Secondary | ICD-10-CM | POA: Diagnosis not present

## 2016-10-24 DIAGNOSIS — J301 Allergic rhinitis due to pollen: Secondary | ICD-10-CM | POA: Diagnosis not present

## 2016-10-24 DIAGNOSIS — J3081 Allergic rhinitis due to animal (cat) (dog) hair and dander: Secondary | ICD-10-CM | POA: Diagnosis not present

## 2016-11-01 DIAGNOSIS — J3081 Allergic rhinitis due to animal (cat) (dog) hair and dander: Secondary | ICD-10-CM | POA: Diagnosis not present

## 2016-11-01 DIAGNOSIS — J3089 Other allergic rhinitis: Secondary | ICD-10-CM | POA: Diagnosis not present

## 2016-11-01 DIAGNOSIS — J301 Allergic rhinitis due to pollen: Secondary | ICD-10-CM | POA: Diagnosis not present

## 2016-11-05 DIAGNOSIS — J301 Allergic rhinitis due to pollen: Secondary | ICD-10-CM | POA: Diagnosis not present

## 2016-11-05 DIAGNOSIS — J309 Allergic rhinitis, unspecified: Secondary | ICD-10-CM | POA: Diagnosis not present

## 2016-11-05 DIAGNOSIS — J3081 Allergic rhinitis due to animal (cat) (dog) hair and dander: Secondary | ICD-10-CM | POA: Diagnosis not present

## 2016-11-12 ENCOUNTER — Encounter: Payer: Self-pay | Admitting: Family Medicine

## 2016-11-12 DIAGNOSIS — R7302 Impaired glucose tolerance (oral): Secondary | ICD-10-CM | POA: Diagnosis not present

## 2016-11-12 DIAGNOSIS — M2550 Pain in unspecified joint: Secondary | ICD-10-CM | POA: Diagnosis not present

## 2016-11-12 DIAGNOSIS — I10 Essential (primary) hypertension: Secondary | ICD-10-CM | POA: Diagnosis not present

## 2016-11-12 DIAGNOSIS — E7800 Pure hypercholesterolemia, unspecified: Secondary | ICD-10-CM | POA: Diagnosis not present

## 2016-11-13 DIAGNOSIS — J3089 Other allergic rhinitis: Secondary | ICD-10-CM | POA: Diagnosis not present

## 2016-11-13 DIAGNOSIS — J301 Allergic rhinitis due to pollen: Secondary | ICD-10-CM | POA: Diagnosis not present

## 2016-11-13 DIAGNOSIS — J3081 Allergic rhinitis due to animal (cat) (dog) hair and dander: Secondary | ICD-10-CM | POA: Diagnosis not present

## 2016-11-14 DIAGNOSIS — D2312 Other benign neoplasm of skin of left eyelid, including canthus: Secondary | ICD-10-CM | POA: Diagnosis not present

## 2016-11-21 DIAGNOSIS — J301 Allergic rhinitis due to pollen: Secondary | ICD-10-CM | POA: Diagnosis not present

## 2016-11-21 DIAGNOSIS — J3081 Allergic rhinitis due to animal (cat) (dog) hair and dander: Secondary | ICD-10-CM | POA: Diagnosis not present

## 2016-11-21 DIAGNOSIS — J3089 Other allergic rhinitis: Secondary | ICD-10-CM | POA: Diagnosis not present

## 2016-11-27 DIAGNOSIS — H04562 Stenosis of left lacrimal punctum: Secondary | ICD-10-CM | POA: Diagnosis not present

## 2016-11-27 DIAGNOSIS — D485 Neoplasm of uncertain behavior of skin: Secondary | ICD-10-CM | POA: Diagnosis not present

## 2016-11-30 DIAGNOSIS — J301 Allergic rhinitis due to pollen: Secondary | ICD-10-CM | POA: Diagnosis not present

## 2016-11-30 DIAGNOSIS — J3081 Allergic rhinitis due to animal (cat) (dog) hair and dander: Secondary | ICD-10-CM | POA: Diagnosis not present

## 2016-11-30 DIAGNOSIS — J3089 Other allergic rhinitis: Secondary | ICD-10-CM | POA: Diagnosis not present

## 2016-12-12 DIAGNOSIS — J3081 Allergic rhinitis due to animal (cat) (dog) hair and dander: Secondary | ICD-10-CM | POA: Diagnosis not present

## 2016-12-12 DIAGNOSIS — J301 Allergic rhinitis due to pollen: Secondary | ICD-10-CM | POA: Diagnosis not present

## 2016-12-12 DIAGNOSIS — J3089 Other allergic rhinitis: Secondary | ICD-10-CM | POA: Diagnosis not present

## 2016-12-18 DIAGNOSIS — J3081 Allergic rhinitis due to animal (cat) (dog) hair and dander: Secondary | ICD-10-CM | POA: Diagnosis not present

## 2016-12-18 DIAGNOSIS — J3089 Other allergic rhinitis: Secondary | ICD-10-CM | POA: Diagnosis not present

## 2016-12-18 DIAGNOSIS — J301 Allergic rhinitis due to pollen: Secondary | ICD-10-CM | POA: Diagnosis not present

## 2016-12-24 DIAGNOSIS — I10 Essential (primary) hypertension: Secondary | ICD-10-CM | POA: Diagnosis not present

## 2016-12-24 DIAGNOSIS — E7800 Pure hypercholesterolemia, unspecified: Secondary | ICD-10-CM | POA: Diagnosis not present

## 2016-12-25 DIAGNOSIS — J301 Allergic rhinitis due to pollen: Secondary | ICD-10-CM | POA: Diagnosis not present

## 2016-12-25 DIAGNOSIS — J3081 Allergic rhinitis due to animal (cat) (dog) hair and dander: Secondary | ICD-10-CM | POA: Diagnosis not present

## 2016-12-25 DIAGNOSIS — J3089 Other allergic rhinitis: Secondary | ICD-10-CM | POA: Diagnosis not present

## 2017-01-02 DIAGNOSIS — J3089 Other allergic rhinitis: Secondary | ICD-10-CM | POA: Diagnosis not present

## 2017-01-02 DIAGNOSIS — J301 Allergic rhinitis due to pollen: Secondary | ICD-10-CM | POA: Diagnosis not present

## 2017-01-02 DIAGNOSIS — J3081 Allergic rhinitis due to animal (cat) (dog) hair and dander: Secondary | ICD-10-CM | POA: Diagnosis not present

## 2017-01-10 DIAGNOSIS — J3089 Other allergic rhinitis: Secondary | ICD-10-CM | POA: Diagnosis not present

## 2017-01-10 DIAGNOSIS — J3081 Allergic rhinitis due to animal (cat) (dog) hair and dander: Secondary | ICD-10-CM | POA: Diagnosis not present

## 2017-01-10 DIAGNOSIS — J301 Allergic rhinitis due to pollen: Secondary | ICD-10-CM | POA: Diagnosis not present

## 2017-01-15 DIAGNOSIS — L301 Dyshidrosis [pompholyx]: Secondary | ICD-10-CM | POA: Diagnosis not present

## 2017-01-15 DIAGNOSIS — B078 Other viral warts: Secondary | ICD-10-CM | POA: Diagnosis not present

## 2017-01-16 DIAGNOSIS — J301 Allergic rhinitis due to pollen: Secondary | ICD-10-CM | POA: Diagnosis not present

## 2017-01-16 DIAGNOSIS — J3089 Other allergic rhinitis: Secondary | ICD-10-CM | POA: Diagnosis not present

## 2017-01-16 DIAGNOSIS — J3081 Allergic rhinitis due to animal (cat) (dog) hair and dander: Secondary | ICD-10-CM | POA: Diagnosis not present

## 2017-01-22 DIAGNOSIS — J301 Allergic rhinitis due to pollen: Secondary | ICD-10-CM | POA: Diagnosis not present

## 2017-01-22 DIAGNOSIS — J3089 Other allergic rhinitis: Secondary | ICD-10-CM | POA: Diagnosis not present

## 2017-01-22 DIAGNOSIS — J3081 Allergic rhinitis due to animal (cat) (dog) hair and dander: Secondary | ICD-10-CM | POA: Diagnosis not present

## 2017-01-25 DIAGNOSIS — Z1231 Encounter for screening mammogram for malignant neoplasm of breast: Secondary | ICD-10-CM | POA: Diagnosis not present

## 2017-01-30 DIAGNOSIS — J3081 Allergic rhinitis due to animal (cat) (dog) hair and dander: Secondary | ICD-10-CM | POA: Diagnosis not present

## 2017-01-30 DIAGNOSIS — J3089 Other allergic rhinitis: Secondary | ICD-10-CM | POA: Diagnosis not present

## 2017-01-30 DIAGNOSIS — J301 Allergic rhinitis due to pollen: Secondary | ICD-10-CM | POA: Diagnosis not present

## 2017-02-06 DIAGNOSIS — J301 Allergic rhinitis due to pollen: Secondary | ICD-10-CM | POA: Diagnosis not present

## 2017-02-06 DIAGNOSIS — B078 Other viral warts: Secondary | ICD-10-CM | POA: Diagnosis not present

## 2017-02-06 DIAGNOSIS — J3089 Other allergic rhinitis: Secondary | ICD-10-CM | POA: Diagnosis not present

## 2017-02-06 DIAGNOSIS — J3081 Allergic rhinitis due to animal (cat) (dog) hair and dander: Secondary | ICD-10-CM | POA: Diagnosis not present

## 2017-02-06 DIAGNOSIS — L301 Dyshidrosis [pompholyx]: Secondary | ICD-10-CM | POA: Diagnosis not present

## 2017-02-08 DIAGNOSIS — J301 Allergic rhinitis due to pollen: Secondary | ICD-10-CM | POA: Diagnosis not present

## 2017-02-11 DIAGNOSIS — J3089 Other allergic rhinitis: Secondary | ICD-10-CM | POA: Diagnosis not present

## 2017-02-11 DIAGNOSIS — J3081 Allergic rhinitis due to animal (cat) (dog) hair and dander: Secondary | ICD-10-CM | POA: Diagnosis not present

## 2017-02-14 DIAGNOSIS — J301 Allergic rhinitis due to pollen: Secondary | ICD-10-CM | POA: Diagnosis not present

## 2017-02-14 DIAGNOSIS — J3081 Allergic rhinitis due to animal (cat) (dog) hair and dander: Secondary | ICD-10-CM | POA: Diagnosis not present

## 2017-02-14 DIAGNOSIS — J3089 Other allergic rhinitis: Secondary | ICD-10-CM | POA: Diagnosis not present

## 2017-02-18 DIAGNOSIS — J4520 Mild intermittent asthma, uncomplicated: Secondary | ICD-10-CM | POA: Diagnosis not present

## 2017-02-18 DIAGNOSIS — J301 Allergic rhinitis due to pollen: Secondary | ICD-10-CM | POA: Diagnosis not present

## 2017-02-18 DIAGNOSIS — J3081 Allergic rhinitis due to animal (cat) (dog) hair and dander: Secondary | ICD-10-CM | POA: Diagnosis not present

## 2017-02-18 DIAGNOSIS — J3089 Other allergic rhinitis: Secondary | ICD-10-CM | POA: Diagnosis not present

## 2017-02-26 ENCOUNTER — Ambulatory Visit (INDEPENDENT_AMBULATORY_CARE_PROVIDER_SITE_OTHER): Payer: 59 | Admitting: Family Medicine

## 2017-02-26 ENCOUNTER — Encounter: Payer: Self-pay | Admitting: Family Medicine

## 2017-02-26 VITALS — BP 124/80 | HR 71 | Temp 98.7°F | Resp 18 | Ht 63.25 in | Wt 182.2 lb

## 2017-02-26 DIAGNOSIS — L409 Psoriasis, unspecified: Secondary | ICD-10-CM

## 2017-02-26 DIAGNOSIS — L609 Nail disorder, unspecified: Secondary | ICD-10-CM | POA: Diagnosis not present

## 2017-02-26 NOTE — Patient Instructions (Addendum)
IF you received an x-ray today, you will receive an invoice from [facility]. Please contact [facility] at [phone] with questions or concerns regarding your invoice.   IF you received labwork today, you will receive an invoice from [place]. Please contact [facility] at [phone] with questions or concerns regarding your invoice.   Our billing staff will not be able to assist you with questions regarding bills from these companies.  You will be contacted with the lab results as soon as they are available. The fastest way to get your results is to activate your My Chart account. Instructions are located on the last page of this paperwork. If you have not heard from [place] regarding the results in 2 weeks, please contact this office.    Salicylic Acid topical gel, cream, lotion, solution What is this medicine? SALICYCLIC ACID (SAL i SIL ik AS id) breaks down layers of thick skin. It is used to treat common and plantar warts, psoriasis, calluses, and corns. It is also used to treat or to prevent acne. This medicine may be used for other purposes; ask your health care provider or pharmacist if you have questions. COMMON BRAND NAME(S): Akurza, Clear Away Liquid, Clearasil Total Control, Clearasil Ultra Scrub, Compound W, Corn/Callus Remover, Dermarest Psoriasis Moisturizer, Dermarest Psoriasis Overnight Treatment, Dermarest Psoriasis Scalp Treatment, Dermarest Psoriasis Skin Treatment, DuoFilm Wart Remover, Gordofilm, Hydrisalic, Keralyt, MOSCO Callus & Corn Remover, Neutrogena Acne Wash, [place], RE SA, SalAC, [employer], [place], [place], [place], [employer], [place], [employer], [place], [employer], [place], [place] 2 in 1 Anti-Dandruff, [place], [place], Wart-Off, [place] What should I tell my health care provider before I take this medicine? They need to know if you have any of these conditions: -child with chickenpox, the flu, or other viral  infection -kidney disease -liver disease -an unusual or allergic reaction to salicylic acid, other medicines, foods, dyes, or preservatives -pregnant or trying to get pregnant -breast-feeding How should I use this medicine? This medicine is for external use only. Follow the directions on the label. Do not apply to raw or irritated skin. Avoid getting medicine in your eyes, lips, nose, mouth, or other sensitive areas. Use this medicine at regular intervals. Do not use more often than directed. Talk to your pediatrician regarding the use of this medicine in children. Special care may be needed. This medicine is not approved for use in children under 21 years old. Overdosage: If you think you have taken too much of this medicine contact a poison control center or emergency room at once. NOTE: This medicine is only for you. Do not share this medicine with others. What if I miss a dose? If you miss a dose, use it as soon as you can. If it is almost time for your next dose, use only that dose. Do not use double or extra doses. What may interact with this medicine? -medicines that change urine pH like ammonium chloride, sodium bicarbonate, and others -medicines that treat or prevent blood clots like warfarin -methotrexate -pyrazinamide -some medicines for diabetes -some medicines for gout -steroid medicines like prednisone or cortisone This list may not describe all possible interactions. Give your health care provider a list of all the medicines, herbs, non-prescription drugs, or dietary supplements you use. Also tell them if you smoke, drink alcohol, or use illegal drugs. Some items may interact with your medicine. What should I watch for while using this medicine? Tell your doctor is your symptoms do not get better or if  they get worse. This medicine can make you more sensitive to the sun. Keep out of the sun. If you cannot avoid being in the sun, wear protective clothing and use sunscreen. Do not  use sun lamps or tanning beds/booths. Use of this medicine in children under 12 years or in patients with kidney or liver disease may increase the risk of serious side effects. These patients should not use this medicine over large areas of skin. If you notice symptoms such as nausea, vomiting, dizziness, loss of hearing, ringing in the ears, unusual weakness or tiredness, fast or labored breathing, diarrhea, or confusion, stop using this medicine and contact your doctor or health care professional. What side effects may I notice from receiving this medicine? Side effects that you should report to your doctor or health care professional as soon as possible: -allergic reactions like skin rash, itching or hives, swelling of the face, lips, or tongue Side effects that usually do not require medical attention (report to your doctor or health care professional if they continue or are bothersome): -skin irritation This list may not describe all possible side effects. Call your doctor for medical advice about side effects. You may report side effects to FDA at 1-800-FDA-1088. Where should I keep my medicine? Keep out of the reach of children. Store at room temperature between 15 and 30 degrees C (59 and 86 degrees F). Do not freeze. Throw away any unused medicine after the expiration date. NOTE: This sheet is a summary. It may not cover all possible information. If you have questions about this medicine, talk to your doctor, pharmacist, or health care provider.  2018 Elsevier/Gold Standard (2008-04-02 13:36:20)

## 2017-02-26 NOTE — Progress Notes (Signed)
Chief Complaint  Patient presents with  . Nail Problem    Saw dermatologist 3-4 wks ago, dx with psoriasis/warts, given salicyclic acid and petroleum to use on nails.  Per patient losing skin and thumbs are swollen and sore and discoloration on tips of thumbs    HPI   Pt was diagnosed with psoriasis and warts She was given salicyclic acid and petroleum to use on nails  State that she now has some skin changes on her nails She sees Allyn Kenner for Dermatology on [address] She was told that the pigmentation is due to her psoriasis She filed down the nail and got it painted and developed stiffness of the thumbs, itching and feels like her nails are worse  Past Medical History:  Diagnosis Date  . Allergy   . Anxiety   . Asthma   . Hyperlipidemia   . Hypertension   . 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 Sickle cell anemia (HCC)    trait  . Squamous cell hyperplasia of vulva     Current Outpatient Prescriptions  Medication Sig Dispense Refill  . amLODipine (NORVASC) 10 MG tablet     . atorvastatin (LIPITOR) 20 MG tablet     . hydrochlorothiazide (HYDRODIURIL) 25 MG tablet Take 1 tablet by mouth Daily.    Marland Kitchen KLOR-CON M20 20 MEQ tablet Take 1 tablet by mouth Daily.    Marland Kitchen levocetirizine (XYZAL) 5 MG tablet     . Nutritional Supplements (ESTROVEN MAXIMUM STRENGTH) TABS Take 1 tablet by mouth daily.    Marland Kitchen PARoxetine (PAXIL) 20 MG tablet take 1 tablet by mouth once daily 30 tablet 0  . AZOR 10-40 MG per tablet Take 1 tablet by mouth Daily.    . Cyanocobalamin (VITAMIN B 12 PO) Take 100 mcg by mouth daily.    Marland Kitchen losartan (COZAAR) 100 MG tablet     . Multiple Vitamin (MULITIVITAMIN WITH MINERALS) TABS Take 1 tablet by mouth daily as needed. Takes about 2-3 a week    . RESTASIS 0.05 % ophthalmic emulsion      No current facility-administered medications for this visit.     Allergies: No Known Allergies  Past Surgical History:  Procedure Laterality Date  . ABDOMINAL  HYSTERECTOMY  08/16/2009   RSO  . FOOT SURGERY Left 2008  . LAPAROSCOPIC HYSTERECTOMY  08/16/09   TLH with RSO    Social History   Social History  . Marital status: Single    Spouse name: N/A  . Number of children: N/A  . Years of education: N/A   Social History Main Topics  . Smoking status: Never Smoker  . Smokeless tobacco: Never Used  . Alcohol use 2.4 oz/week    4 Glasses of wine per week  . Drug use: No  . Sexual activity: Yes    Partners: Male    Birth control/ protection: Surgical     Comment: hysterectomy   Other Topics Concern  . None   Social History Narrative  . None    ROS No fevers or chills No knee pain No other skin rash No wheezing or sob  Objective: Vitals:   02/26/17 1706  BP: 124/80  Pulse: 71  Resp: 18  Temp: 98.7 F (37.1 C)  TempSrc: Oral  SpO2: 99%  Weight: 182 lb 3.2 oz (82.6 kg)  Height: 5' 3.25" (1.607 m)    Physical Exam  Constitutional: She is oriented to person, place, and time. She appears well-developed and well-nourished.  Eyes: Conjunctivae  are normal.  Pulmonary/Chest: Effort normal.  Neurological: She is alert and oriented to person, place, and time.  Skin: Skin is warm. Capillary refill takes less than 2 seconds.  Psychiatric: She has a normal mood and affect. Her behavior is normal. Judgment and thought content normal.    Both hands with hyperpigmentation in the palms The joints of the thumb appear stiff but no effusion  The range of motion is normal Her nails and hypertrophied and the cuticle is cracking but no purulent drainage No erythema  Assessment and Plan [place] was seen today for nail problem.  Diagnoses and all orders for this visit:  Nail abnormalities Psoriasis  follow up with Dermatology as there seems to be a noticeable darkening around the nail Discussed that this may also be a change in her psoriasis that can affect the joint Gave referral for Rheumatology- Dr. Meda Coffee at [place]

## 2017-02-28 DIAGNOSIS — J301 Allergic rhinitis due to pollen: Secondary | ICD-10-CM | POA: Diagnosis not present

## 2017-02-28 DIAGNOSIS — J3081 Allergic rhinitis due to animal (cat) (dog) hair and dander: Secondary | ICD-10-CM | POA: Diagnosis not present

## 2017-02-28 DIAGNOSIS — J3089 Other allergic rhinitis: Secondary | ICD-10-CM | POA: Diagnosis not present

## 2017-03-06 DIAGNOSIS — I10 Essential (primary) hypertension: Secondary | ICD-10-CM | POA: Diagnosis not present

## 2017-03-06 DIAGNOSIS — E7800 Pure hypercholesterolemia, unspecified: Secondary | ICD-10-CM | POA: Diagnosis not present

## 2017-03-06 DIAGNOSIS — J301 Allergic rhinitis due to pollen: Secondary | ICD-10-CM | POA: Diagnosis not present

## 2017-03-06 DIAGNOSIS — J3089 Other allergic rhinitis: Secondary | ICD-10-CM | POA: Diagnosis not present

## 2017-03-13 DIAGNOSIS — Z79899 Other long term (current) drug therapy: Secondary | ICD-10-CM | POA: Diagnosis not present

## 2017-03-13 DIAGNOSIS — L403 Pustulosis palmaris et plantaris: Secondary | ICD-10-CM | POA: Diagnosis not present

## 2017-03-13 DIAGNOSIS — L409 Psoriasis, unspecified: Secondary | ICD-10-CM | POA: Diagnosis not present

## 2017-03-15 DIAGNOSIS — Z01419 Encounter for gynecological examination (general) (routine) without abnormal findings: Secondary | ICD-10-CM | POA: Diagnosis not present

## 2017-03-16 DIAGNOSIS — Z23 Encounter for immunization: Secondary | ICD-10-CM | POA: Diagnosis not present

## 2017-03-18 DIAGNOSIS — J3081 Allergic rhinitis due to animal (cat) (dog) hair and dander: Secondary | ICD-10-CM | POA: Diagnosis not present

## 2017-03-18 DIAGNOSIS — J301 Allergic rhinitis due to pollen: Secondary | ICD-10-CM | POA: Diagnosis not present

## 2017-03-18 DIAGNOSIS — J3089 Other allergic rhinitis: Secondary | ICD-10-CM | POA: Diagnosis not present

## 2017-03-20 DIAGNOSIS — J3081 Allergic rhinitis due to animal (cat) (dog) hair and dander: Secondary | ICD-10-CM | POA: Diagnosis not present

## 2017-03-20 DIAGNOSIS — J301 Allergic rhinitis due to pollen: Secondary | ICD-10-CM | POA: Diagnosis not present

## 2017-03-20 DIAGNOSIS — J3089 Other allergic rhinitis: Secondary | ICD-10-CM | POA: Diagnosis not present

## 2017-03-25 DIAGNOSIS — J301 Allergic rhinitis due to pollen: Secondary | ICD-10-CM | POA: Diagnosis not present

## 2017-03-25 DIAGNOSIS — J3081 Allergic rhinitis due to animal (cat) (dog) hair and dander: Secondary | ICD-10-CM | POA: Diagnosis not present

## 2017-03-25 DIAGNOSIS — J3089 Other allergic rhinitis: Secondary | ICD-10-CM | POA: Diagnosis not present

## 2017-03-29 DIAGNOSIS — J301 Allergic rhinitis due to pollen: Secondary | ICD-10-CM | POA: Diagnosis not present

## 2017-03-29 DIAGNOSIS — J3089 Other allergic rhinitis: Secondary | ICD-10-CM | POA: Diagnosis not present

## 2017-03-29 DIAGNOSIS — J3081 Allergic rhinitis due to animal (cat) (dog) hair and dander: Secondary | ICD-10-CM | POA: Diagnosis not present

## 2017-04-02 DIAGNOSIS — L409 Psoriasis, unspecified: Secondary | ICD-10-CM | POA: Diagnosis not present

## 2017-04-02 DIAGNOSIS — Z79899 Other long term (current) drug therapy: Secondary | ICD-10-CM | POA: Diagnosis not present

## 2017-04-02 DIAGNOSIS — L403 Pustulosis palmaris et plantaris: Secondary | ICD-10-CM | POA: Diagnosis not present

## 2017-04-03 DIAGNOSIS — J3081 Allergic rhinitis due to animal (cat) (dog) hair and dander: Secondary | ICD-10-CM | POA: Diagnosis not present

## 2017-04-03 DIAGNOSIS — J301 Allergic rhinitis due to pollen: Secondary | ICD-10-CM | POA: Diagnosis not present

## 2017-04-03 DIAGNOSIS — J3089 Other allergic rhinitis: Secondary | ICD-10-CM | POA: Diagnosis not present

## 2017-04-08 DIAGNOSIS — L409 Psoriasis, unspecified: Secondary | ICD-10-CM | POA: Diagnosis not present

## 2017-04-10 DIAGNOSIS — J3089 Other allergic rhinitis: Secondary | ICD-10-CM | POA: Diagnosis not present

## 2017-04-10 DIAGNOSIS — J3081 Allergic rhinitis due to animal (cat) (dog) hair and dander: Secondary | ICD-10-CM | POA: Diagnosis not present

## 2017-04-10 DIAGNOSIS — J301 Allergic rhinitis due to pollen: Secondary | ICD-10-CM | POA: Diagnosis not present

## 2017-04-17 DIAGNOSIS — J3081 Allergic rhinitis due to animal (cat) (dog) hair and dander: Secondary | ICD-10-CM | POA: Diagnosis not present

## 2017-04-17 DIAGNOSIS — J301 Allergic rhinitis due to pollen: Secondary | ICD-10-CM | POA: Diagnosis not present

## 2017-04-17 DIAGNOSIS — J3089 Other allergic rhinitis: Secondary | ICD-10-CM | POA: Diagnosis not present

## 2017-04-24 DIAGNOSIS — L409 Psoriasis, unspecified: Secondary | ICD-10-CM | POA: Diagnosis not present

## 2017-04-24 DIAGNOSIS — L403 Pustulosis palmaris et plantaris: Secondary | ICD-10-CM | POA: Diagnosis not present

## 2017-04-24 DIAGNOSIS — Z79899 Other long term (current) drug therapy: Secondary | ICD-10-CM | POA: Diagnosis not present

## 2017-05-02 DIAGNOSIS — J301 Allergic rhinitis due to pollen: Secondary | ICD-10-CM | POA: Diagnosis not present

## 2017-05-02 DIAGNOSIS — J3081 Allergic rhinitis due to animal (cat) (dog) hair and dander: Secondary | ICD-10-CM | POA: Diagnosis not present

## 2017-05-02 DIAGNOSIS — J3089 Other allergic rhinitis: Secondary | ICD-10-CM | POA: Diagnosis not present

## 2017-05-14 DIAGNOSIS — J3081 Allergic rhinitis due to animal (cat) (dog) hair and dander: Secondary | ICD-10-CM | POA: Diagnosis not present

## 2017-05-14 DIAGNOSIS — J301 Allergic rhinitis due to pollen: Secondary | ICD-10-CM | POA: Diagnosis not present

## 2017-05-14 DIAGNOSIS — J3089 Other allergic rhinitis: Secondary | ICD-10-CM | POA: Diagnosis not present

## 2017-06-13 DIAGNOSIS — M0579 Rheumatoid arthritis with rheumatoid factor of multiple sites without organ or systems involvement: Secondary | ICD-10-CM | POA: Diagnosis not present

## 2017-06-17 DIAGNOSIS — J3089 Other allergic rhinitis: Secondary | ICD-10-CM | POA: Diagnosis not present

## 2017-06-17 DIAGNOSIS — J3081 Allergic rhinitis due to animal (cat) (dog) hair and dander: Secondary | ICD-10-CM | POA: Diagnosis not present

## 2017-06-17 DIAGNOSIS — J301 Allergic rhinitis due to pollen: Secondary | ICD-10-CM | POA: Diagnosis not present

## 2017-07-03 DIAGNOSIS — M25571 Pain in right ankle and joints of right foot: Secondary | ICD-10-CM | POA: Diagnosis not present

## 2017-07-03 DIAGNOSIS — S93401A Sprain of unspecified ligament of right ankle, initial encounter: Secondary | ICD-10-CM | POA: Diagnosis not present

## 2017-07-08 DIAGNOSIS — M25571 Pain in right ankle and joints of right foot: Secondary | ICD-10-CM | POA: Diagnosis not present

## 2017-07-08 DIAGNOSIS — G8929 Other chronic pain: Secondary | ICD-10-CM | POA: Diagnosis not present

## 2017-07-15 DIAGNOSIS — L409 Psoriasis, unspecified: Secondary | ICD-10-CM | POA: Diagnosis not present

## 2017-07-18 DIAGNOSIS — J3089 Other allergic rhinitis: Secondary | ICD-10-CM | POA: Diagnosis not present

## 2017-07-18 DIAGNOSIS — J3081 Allergic rhinitis due to animal (cat) (dog) hair and dander: Secondary | ICD-10-CM | POA: Diagnosis not present

## 2017-07-18 DIAGNOSIS — J301 Allergic rhinitis due to pollen: Secondary | ICD-10-CM | POA: Diagnosis not present

## 2017-07-23 DIAGNOSIS — E7800 Pure hypercholesterolemia, unspecified: Secondary | ICD-10-CM | POA: Diagnosis not present

## 2017-07-23 DIAGNOSIS — I10 Essential (primary) hypertension: Secondary | ICD-10-CM | POA: Diagnosis not present

## 2017-07-25 DIAGNOSIS — Z79899 Other long term (current) drug therapy: Secondary | ICD-10-CM | POA: Diagnosis not present

## 2017-07-25 DIAGNOSIS — L409 Psoriasis, unspecified: Secondary | ICD-10-CM | POA: Diagnosis not present

## 2017-07-25 DIAGNOSIS — L403 Pustulosis palmaris et plantaris: Secondary | ICD-10-CM | POA: Diagnosis not present

## 2017-08-02 DIAGNOSIS — M7989 Other specified soft tissue disorders: Secondary | ICD-10-CM | POA: Diagnosis not present

## 2017-08-02 DIAGNOSIS — L409 Psoriasis, unspecified: Secondary | ICD-10-CM | POA: Diagnosis not present

## 2017-08-02 DIAGNOSIS — M79671 Pain in right foot: Secondary | ICD-10-CM | POA: Diagnosis not present

## 2017-08-02 DIAGNOSIS — L403 Pustulosis palmaris et plantaris: Secondary | ICD-10-CM | POA: Diagnosis not present

## 2017-08-02 DIAGNOSIS — G8929 Other chronic pain: Secondary | ICD-10-CM | POA: Diagnosis not present

## 2017-08-12 DIAGNOSIS — J3081 Allergic rhinitis due to animal (cat) (dog) hair and dander: Secondary | ICD-10-CM | POA: Diagnosis not present

## 2017-08-12 DIAGNOSIS — J301 Allergic rhinitis due to pollen: Secondary | ICD-10-CM | POA: Diagnosis not present

## 2017-08-12 DIAGNOSIS — J3089 Other allergic rhinitis: Secondary | ICD-10-CM | POA: Diagnosis not present

## 2017-09-10 ENCOUNTER — Ambulatory Visit: Payer: Self-pay | Admitting: Family Medicine

## 2017-09-13 ENCOUNTER — Ambulatory Visit: Payer: 59 | Admitting: Family Medicine

## 2017-09-13 ENCOUNTER — Encounter: Payer: Self-pay | Admitting: Family Medicine

## 2017-09-13 VITALS — BP 118/82 | HR 72 | Temp 98.1°F | Ht 62.6 in | Wt 183.9 lb

## 2017-09-13 DIAGNOSIS — Z7689 Persons encountering health services in other specified circumstances: Secondary | ICD-10-CM

## 2017-09-13 DIAGNOSIS — J301 Allergic rhinitis due to pollen: Secondary | ICD-10-CM | POA: Diagnosis not present

## 2017-09-13 DIAGNOSIS — J3089 Other allergic rhinitis: Secondary | ICD-10-CM | POA: Diagnosis not present

## 2017-09-13 DIAGNOSIS — Z9109 Other allergy status, other than to drugs and biological substances: Secondary | ICD-10-CM

## 2017-09-13 DIAGNOSIS — J4520 Mild intermittent asthma, uncomplicated: Secondary | ICD-10-CM

## 2017-09-13 DIAGNOSIS — I10 Essential (primary) hypertension: Secondary | ICD-10-CM

## 2017-09-13 MED ORDER — HYDROCHLOROTHIAZIDE 25 MG PO TABS: 25.0000 mg | ORAL_TABLET | Freq: Every day | ORAL | 11 refills | Status: DC

## 2017-09-13 MED ORDER — AMLODIPINE BESYLATE 10 MG PO TABS: 10.0000 mg | ORAL_TABLET | Freq: Every day | ORAL | 11 refills | Status: DC

## 2017-09-13 MED ORDER — IRBESARTAN 300 MG PO TABS: 300.0000 mg | ORAL_TABLET | Freq: Every day | ORAL | 11 refills | Status: DC

## 2017-09-13 NOTE — Patient Instructions (Addendum)
DASH Eating Plan DASH stands for "Dietary Approaches to Stop Hypertension." The DASH eating plan is a healthy eating plan that has been shown to reduce high blood pressure (hypertension). It may also reduce your risk for type 2 diabetes, heart disease, and stroke. The DASH eating plan may also help with weight loss. What are tips for following this plan? General guidelines  Avoid eating more than 2,300 mg (milligrams) of salt (sodium) a day. If you have hypertension, you may need to reduce your sodium intake to 1,500 mg a day.  Limit alcohol intake to no more than 1 drink a day for nonpregnant women and 2 drinks a day for men. One drink equals 12 oz of beer, 5 oz of wine, or 1 oz of hard liquor.  Work with your health care provider to maintain a healthy body weight or to lose weight. Ask what an ideal weight is for you.  Get at least 30 minutes of exercise that causes your heart to beat faster (aerobic exercise) most days of the week. Activities may include walking, swimming, or biking.  Work with your health care provider or diet and nutrition specialist (dietitian) to adjust your eating plan to your individual calorie needs. Reading food labels  Check food labels for the amount of sodium per serving. Choose foods with less than 5 percent of the Daily Value of sodium. Generally, foods with less than 300 mg of sodium per serving fit into this eating plan.  To find whole grains, look for the word "whole" as the first word in the ingredient list. Shopping  Buy products labeled as "low-sodium" or "no salt added."  Buy fresh foods. Avoid canned foods and premade or frozen meals. Cooking  Avoid adding salt when cooking. Use salt-free seasonings or herbs instead of table salt or sea salt. Check with your health care provider or pharmacist before using salt substitutes.  Do not fry foods. Cook foods using healthy methods such as baking, boiling, grilling, and broiling instead.  Cook with  heart-healthy oils, such as olive, canola, soybean, or sunflower oil. Meal planning   Eat a balanced diet that includes: ? 5 or more servings of fruits and vegetables each day. At each meal, try to fill half of your plate with fruits and vegetables. ? Up to 6-8 servings of whole grains each day. ? Less than 6 oz of lean meat, poultry, or fish each day. A 3-oz serving of meat is about the same size as a deck of cards. One egg equals 1 oz. ? 2 servings of low-fat dairy each day. ? A serving of nuts, seeds, or beans 5 times each week. ? Heart-healthy fats. Healthy fats called Omega-3 fatty acids are found in foods such as flaxseeds and coldwater fish, like sardines, salmon, and mackerel.  Limit how much you eat of the following: ? Canned or prepackaged foods. ? Food that is high in trans fat, such as fried foods. ? Food that is high in saturated fat, such as fatty meat. ? Sweets, desserts, sugary drinks, and other foods with added sugar. ? Full-fat dairy products.  Do not salt foods before eating.  Try to eat at least 2 vegetarian meals each week.  Eat more home-cooked food and less restaurant, buffet, and fast food.  When eating at a restaurant, ask that your food be prepared with less salt or no salt, if possible. What foods are recommended? The items listed may not be a complete list. Talk with your dietitian about what   dietary choices are best for you. Grains Whole-grain or whole-wheat bread. Whole-grain or whole-wheat pasta. Brown rice. Oatmeal. Quinoa. Bulgur. Whole-grain and low-sodium cereals. Pita bread. Low-fat, low-sodium crackers. Whole-wheat flour tortillas. Vegetables Fresh or frozen vegetables (raw, steamed, roasted, or grilled). Low-sodium or reduced-sodium tomato and vegetable juice. Low-sodium or reduced-sodium tomato sauce and tomato paste. Low-sodium or reduced-sodium canned vegetables. Fruits All fresh, dried, or frozen fruit. Canned fruit in natural juice (without  added sugar). Meat and other protein foods Skinless chicken or turkey. Ground chicken or turkey. Pork with fat trimmed off. Fish and seafood. Egg whites. Dried beans, peas, or lentils. Unsalted nuts, nut butters, and seeds. Unsalted canned beans. Lean cuts of beef with fat trimmed off. Low-sodium, lean deli meat. Dairy Low-fat (1%) or fat-free (skim) milk. Fat-free, low-fat, or reduced-fat cheeses. Nonfat, low-sodium ricotta or cottage cheese. Low-fat or nonfat yogurt. Low-fat, low-sodium cheese. Fats and oils Soft margarine without trans fats. Vegetable oil. Low-fat, reduced-fat, or light mayonnaise and salad dressings (reduced-sodium). Canola, safflower, olive, soybean, and sunflower oils. Avocado. Seasoning and other foods Herbs. Spices. Seasoning mixes without salt. Unsalted popcorn and pretzels. Fat-free sweets. What foods are not recommended? The items listed may not be a complete list. Talk with your dietitian about what dietary choices are best for you. Grains Baked goods made with fat, such as croissants, muffins, or some breads. Dry pasta or rice meal packs. Vegetables Creamed or fried vegetables. Vegetables in a cheese sauce. Regular canned vegetables (not low-sodium or reduced-sodium). Regular canned tomato sauce and paste (not low-sodium or reduced-sodium). Regular tomato and vegetable juice (not low-sodium or reduced-sodium). Pickles. Olives. Fruits Canned fruit in a light or heavy syrup. Fried fruit. Fruit in cream or butter sauce. Meat and other protein foods Fatty cuts of meat. Ribs. Fried meat. Bacon. Sausage. Bologna and other processed lunch meats. Salami. Fatback. Hotdogs. Bratwurst. Salted nuts and seeds. Canned beans with added salt. Canned or smoked fish. Whole eggs or egg yolks. Chicken or turkey with skin. Dairy Whole or 2% milk, cream, and half-and-half. Whole or full-fat cream cheese. Whole-fat or sweetened yogurt. Full-fat cheese. Nondairy creamers. Whipped toppings.  Processed cheese and cheese spreads. Fats and oils Butter. Stick margarine. Lard. Shortening. Ghee. Bacon fat. Tropical oils, such as coconut, palm kernel, or palm oil. Seasoning and other foods Salted popcorn and pretzels. Onion salt, garlic salt, seasoned salt, table salt, and sea salt. Worcestershire sauce. Tartar sauce. Barbecue sauce. Teriyaki sauce. Soy sauce, including reduced-sodium. Steak sauce. Canned and packaged gravies. Fish sauce. Oyster sauce. Cocktail sauce. Horseradish that you find on the shelf. Ketchup. Mustard. Meat flavorings and tenderizers. Bouillon cubes. Hot sauce and Tabasco sauce. Premade or packaged marinades. Premade or packaged taco seasonings. Relishes. Regular salad dressings. Where to find more information:  National Heart, Lung, and Blood Institute: www.nhlbi.nih.gov  American Heart Association: www.heart.org Summary  The DASH eating plan is a healthy eating plan that has been shown to reduce high blood pressure (hypertension). It may also reduce your risk for type 2 diabetes, heart disease, and stroke.  With the DASH eating plan, you should limit salt (sodium) intake to 2,300 mg a day. If you have hypertension, you may need to reduce your sodium intake to 1,500 mg a day.  When on the DASH eating plan, aim to eat more fresh fruits and vegetables, whole grains, lean proteins, low-fat dairy, and heart-healthy fats.  Work with your health care provider or diet and nutrition specialist (dietitian) to adjust your eating plan to your individual   calorie needs. This information is not intended to replace advice given to you by your health care provider. Make sure you discuss any questions you have with your health care provider. Document Released: 07/19/2011 Document Revised: 07/23/2016 Document Reviewed: 07/23/2016 Elsevier Interactive Patient Education  2018 [employer].  Managing Your Hypertension Hypertension is commonly called high blood pressure. This is when  the force of your blood pressing against the walls of your arteries is too strong. Arteries are blood vessels that carry blood from your heart throughout your body. Hypertension forces the heart to work harder to pump blood, and may cause the arteries to become narrow or stiff. Having untreated or uncontrolled hypertension can cause heart attack, stroke, kidney disease, and other problems. What are blood pressure readings? A blood pressure reading consists of a higher number over a lower number. Ideally, your blood pressure should be below 120/80. The first ("top") number is called the systolic pressure. It is a measure of the pressure in your arteries as your heart beats. The second ("bottom") number is called the diastolic pressure. It is a measure of the pressure in your arteries as the heart relaxes. What does my blood pressure reading mean? Blood pressure is classified into four stages. Based on your blood pressure reading, your health care provider may use the following stages to determine what type of treatment you need, if any. Systolic pressure and diastolic pressure are measured in a unit called mm Hg. Normal  Systolic pressure: below 474.  Diastolic pressure: below 80. Elevated  Systolic pressure: 259-563.  Diastolic pressure: below 80. Hypertension stage 1  Systolic pressure: 875-643.  Diastolic pressure: 32-95. Hypertension stage 2  Systolic pressure: 188 or above.  Diastolic pressure: 90 or above. What health risks are associated with hypertension? Managing your hypertension is an important responsibility. Uncontrolled hypertension can lead to:  A heart attack.  A stroke.  A weakened blood vessel (aneurysm).  Heart failure.  Kidney damage.  Eye damage.  Metabolic syndrome.  Memory and concentration problems.  What changes can I make to manage my hypertension? Hypertension can be managed by making lifestyle changes and possibly by taking medicines. Your  health care provider will help you make a plan to bring your blood pressure within a normal range. Eating and drinking  Eat a diet that is high in fiber and potassium, and low in salt (sodium), added sugar, and fat. An example eating plan is called the DASH (Dietary Approaches to Stop Hypertension) diet. To eat this way: ? Eat plenty of fresh fruits and vegetables. Try to fill half of your plate at each meal with fruits and vegetables. ? Eat whole grains, such as whole wheat pasta, brown rice, or whole grain bread. Fill about one quarter of your plate with whole grains. ? Eat low-fat diary products. ? Avoid fatty cuts of meat, processed or cured meats, and poultry with skin. Fill about one quarter of your plate with lean proteins such as fish, chicken without skin, beans, eggs, and tofu. ? Avoid premade and processed foods. These tend to be higher in sodium, added sugar, and fat.  Reduce your daily sodium intake. Most people with hypertension should eat less than 1,500 mg of sodium a day.  Limit alcohol intake to no more than 1 drink a day for nonpregnant women and 2 drinks a day for men. One drink equals 12 oz of beer, 5 oz of wine, or 1 oz of hard liquor. Lifestyle  Work with your health care provider  to maintain a healthy body weight, or to lose weight. Ask what an ideal weight is for you.  Get at least 30 minutes of exercise that causes your heart to beat faster (aerobic exercise) most days of the week. Activities may include walking, swimming, or biking.  Include exercise to strengthen your muscles (resistance exercise), such as weight lifting, as part of your weekly exercise routine. Try to do these types of exercises for 30 minutes at least 3 days a week.  Do not use any products that contain nicotine or tobacco, such as cigarettes and e-cigarettes. If you need help quitting, ask your health care provider.  Control any long-term (chronic) conditions you have, such as high cholesterol  or diabetes. Monitoring  Monitor your blood pressure at home as told by your health care provider. Your personal target blood pressure may vary depending on your medical conditions, your age, and other factors.  Have your blood pressure checked regularly, as often as told by your health care provider. Working with your health care provider  Review all the medicines you take with your health care provider because there may be side effects or interactions.  Talk with your health care provider about your diet, exercise habits, and other lifestyle factors that may be contributing to hypertension.  Visit your health care provider regularly. Your health care provider can help you create and adjust your plan for managing hypertension. Will I need medicine to control my blood pressure? Your health care provider may prescribe medicine if lifestyle changes are not enough to get your blood pressure under control, and if:  Your systolic blood pressure is 130 or higher.  Your diastolic blood pressure is 80 or higher.  Take medicines only as told by your health care provider. Follow the directions carefully. Blood pressure medicines must be taken as prescribed. The medicine does not work as well when you skip doses. Skipping doses also puts you at risk for problems. Contact a health care provider if:  You think you are having a reaction to medicines you have taken.  You have repeated (recurrent) headaches.  You feel dizzy.  You have swelling in your ankles.  You have trouble with your vision. Get help right away if:  You develop a severe headache or confusion.  You have unusual weakness or numbness, or you feel faint.  You have severe pain in your chest or abdomen.  You vomit repeatedly.  You have trouble breathing. Summary  Hypertension is when the force of blood pumping through your arteries is too strong. If this condition is not controlled, it may put you at risk for serious  complications.  Your personal target blood pressure may vary depending on your medical conditions, your age, and other factors. For most people, a normal blood pressure is less than 120/80.  Hypertension is managed by lifestyle changes, medicines, or both. Lifestyle changes include weight loss, eating a healthy, low-sodium diet, exercising more, and limiting alcohol. This information is not intended to replace advice given to you by your health care provider. Make sure you discuss any questions you have with your health care provider. Document Released: 04/23/2012 Document Revised: 06/27/2016 Document Reviewed: 06/27/2016 Elsevier Interactive Patient Education  2018 Else[employer] Inc[employer]  

## 2017-09-13 NOTE — Progress Notes (Signed)
Patient presents to clinic today to f/u on chronic issues and to establish care.  SUBJECTIVE: PMH:Pt is a 57 yo female with pmh sig for allergies, asthma, HTN, and psoriasis.  Pt is followed by Rheumatology.  Her last pcp was Dr. Jeanann Lewandowsky.  Last physical December 2018.  Patient is also followed by Dr. Valentino Saxon and Dr. Tonna Boehringer  HTN: -pt is taking Norvasc 10 mg, HCTZ 25 mg, and Losartan 100 mg. -of note: pt's losartan was recently changed to irbesartan 300 mg. -Pt also taking klor-con 20 mEq daily -pt denies HA, CP, blurred vision.  Asthma: -pt was formerly on breo Scientist, clinical (histocompatibility and immunogenetics) -she states she has not had an attack in a while. -improvement in asthma noted after receiving allergy shots -typically symptoms are triggered by Dust, dog hair, and cold weather. -pt does have a rescue inhaler just in case   Environmental allergies: -Patient currently receiving allergy shots  Psoriasis: -Fairly recent diagnosis -Patient is followed by rheumatology. -She is currently on methotrexate and Humira  Allergies: NKDA Dust, cat hair, dog hair  Surgical history: Left foot surgery to remove a bone in her fifth digit Nasal surgery Procedure done by an oculofacial plastic surgeon to remove a growth on her left eyelid. Hysterectomy 2/2 fibroids and cyst on her ovary  Social history: Patient is single.  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  Patient does not have any children.  Patient denies tobacco or drug use.  Patient endorses alcohol use.  Patient states she tries to drink less alcohol now that she is taking methotrexate and Humira for psoriasis.  Family medical history: Mom-Alive, arthritis, DM, HLD, HTN, miscarriage Dad-deceased, DM, HTN, stroke Sister Sherry, DM, HTN MG F-prostate cancer, stroke PGM-deceased, arthritis, colon cancer, MI PGF-deceased, lung cancer, tobacco use  Health Maintenance: Immunizations --pt does not get flu shots,  tetanus 2010, shingles vaccine 2018. Colonoscopy --2015 Mammogram --2018 PAP -- 2018 LMP--2010 Eye exam--2018   Past Medical History:  Diagnosis Date  . Allergy   . Anxiety   . Asthma   . Hyperlipidemia   . Hypertension   . 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 Sickle cell anemia (HCC)    trait  . Squamous cell hyperplasia of vulva     Past Surgical History:  Procedure Laterality Date  . ABDOMINAL HYSTERECTOMY  08/16/2009   RSO  . FOOT SURGERY Left 2008  . LAPAROSCOPIC HYSTERECTOMY  08/16/09   TLH with RSO    Current Outpatient Medications on File Prior to Visit  Medication Sig Dispense Refill  . atorvastatin (LIPITOR) 20 MG tablet     . AZOR 10-40 MG per tablet Take 1 tablet by mouth Daily.    Marland Kitchen EPINEPHrine (EPIPEN 2-PAK) 0.3 mg/0.3 mL IJ SOAJ injection EpiPen 2-Pak 0.3 mg/0.3 mL injection, auto-injector    . folic acid (FOLVITE) 1 MG tablet   1  . HUMIRA PEN 40 MG/0.8ML PNKT     . KLOR-CON M20 20 MEQ tablet Take 1 tablet by mouth Daily.    Marland Kitchen levocetirizine (XYZAL) 5 MG tablet     . levocetirizine (XYZAL) 5 MG tablet     . methotrexate (RHEUMATREX) 2.5 MG tablet 6 TABS ONCE A WEEK (15 MG ) X 12 WEEKS    . Multiple Vitamin (MULITIVITAMIN WITH MINERALS) TABS Take 1 tablet by mouth daily as needed. Takes about 2-3 a week    . Nutritional Supplements (ESTROVEN MAXIMUM STRENGTH) TABS Take 1 tablet by  mouth daily.    Marland Kitchen PARoxetine (PAXIL) 20 MG tablet take 1 tablet by mouth once daily 30 tablet 0   No current facility-administered medications on file prior to visit.     Allergies  Allergen Reactions  . Cat Hair Extract Itching    Can't be around cats.  . Mold Extract  [Trichophyton] Other (See Comments)    As an asthmatic reaction  . Other Itching, Shortness Of Breath and Other (See Comments)    Can be around dogs because of allergy shots. Mayonaise- itching Peanut butter- itching *Patient stills eats these foods.    Family History  Problem Relation Age  of Onset  . Diabetes Father   . Stroke Father   . Hypertension Maternal Grandmother   . Diabetes Mother   . Hypertension Maternal Grandfather   . Colon cancer Paternal Grandmother   . Hypertension Paternal Grandmother   . Heart attack Paternal Grandmother   . Hypertension Paternal Grandfather     Social History   Socioeconomic History  . Marital status: Single    Spouse name: Not on file  . Number of children: Not on file  . Years of education: Not on file  . Highest education level: Not on file  Social Needs  . Financial resource strain: Not on file  . Food insecurity - worry: Not on file  . Food insecurity - inability: Not on file  . Transportation needs - medical: Not on file  . Transportation needs - non-medical: Not on file  Occupational History  . Not on file  Tobacco Use  . Smoking status: Never Smoker  . Smokeless tobacco: Never Used  Substance and Sexual Activity  . Alcohol use: Yes    Alcohol/week: 2.4 oz    Types: 4 Glasses of wine per week  . Drug use: No  . Sexual activity: Yes    Partners: Male    Birth control/protection: Surgical    Comment: hysterectomy  Other Topics Concern  . Not on file  Social History Narrative  . Not on file    ROS General: Denies fever, chills, night sweats, changes in weight, changes in appetite HEENT: Denies headaches, ear pain, changes in vision, rhinorrhea, sore throat CV: Denies CP, palpitations, SOB, orthopnea Pulm: Denies SOB, cough, wheezing GI: Denies abdominal pain, nausea, vomiting, diarrhea, constipation GU: Denies dysuria, hematuria, frequency, vaginal discharge Msk: Denies muscle cramps, joint pains Neuro: Denies weakness, numbness, tingling Skin: Denies rashes, bruising Psych: Denies anxiety, depression, hallucinations.   +on paxil.  BP 118/82 (BP Location: Right Arm, Patient Position: Sitting, Cuff Size: Large)   Pulse 72   Temp 98.1 F (36.7 C) (Oral)   Ht 5' 2.6" (1.59 m)   Wt 183 lb 14.4 oz (83.4  kg)   LMP 08/16/2009   BMI 32.99 kg/m   Physical Exam Gen. Pleasant, well developed, well-nourished, in NAD HEENT - [place]/AT, PERRL, no scleral icterus, no nasal drainage, pharynx without erythema or exudate.  TMs normal bilaterally. Neck: No JVD, no thyromegaly, no carotid bruits Lungs: no use of accessory muscles, CTAB, no wheezes, rales or rhonchi Cardiovascular: RRR, No r/g/m, no peripheral edema Abdomen: BS present, soft, nontender,nondistended Musculoskeletal: No deformities, moves all four extremities, no cyanosis or clubbing, normal tone Neuro:  A&Ox3, CN II-XII intact, normal gait Skin:  Warm, dry, intact, no lesions Psych: normal affect, mood appropriate  No results found for this or any previous visit (from the past 2160 hour(s)).  Assessment/Plan: Essential hypertension  -controlled -given handouts on DASH  diet.  Discussed decreasing sodium intake. -encouraged to monitor bp at home. -Plan: hydrochlorothiazide (HYDRODIURIL) 25 MG tablet, irbesartan (AVAPRO) 300 MG tablet, amLODipine (NORVASC) 10 MG tablet  Mild intermittent asthma without complication -controlled -use albuterol inhaler prn  Environmental allergies -continue allergy shots with Allergist -avoid triggers if possible  Encounter to establish care -We reviewed the PMH, PSH, FH, SH, Meds and Allergies. -We provided refills for any medications we will prescribe as needed. -We addressed current concerns per orders and patient instructions. -We have asked for records for pertinent exams, studies, vaccines and notes from previous providers. -We have advised patient to follow up per instructions below.   F/u prn.  Next CPE due Dec 2019.  Grier Mitts, MD

## 2017-09-17 ENCOUNTER — Encounter: Payer: Self-pay | Admitting: Family Medicine

## 2017-10-02 ENCOUNTER — Ambulatory Visit: Payer: Self-pay | Admitting: Family Medicine

## 2017-10-18 DIAGNOSIS — J3081 Allergic rhinitis due to animal (cat) (dog) hair and dander: Secondary | ICD-10-CM | POA: Diagnosis not present

## 2017-10-18 DIAGNOSIS — J301 Allergic rhinitis due to pollen: Secondary | ICD-10-CM | POA: Diagnosis not present

## 2017-10-18 DIAGNOSIS — J3089 Other allergic rhinitis: Secondary | ICD-10-CM | POA: Diagnosis not present

## 2017-10-30 DIAGNOSIS — G8929 Other chronic pain: Secondary | ICD-10-CM | POA: Diagnosis not present

## 2017-10-30 DIAGNOSIS — M76821 Posterior tibial tendinitis, right leg: Secondary | ICD-10-CM | POA: Diagnosis not present

## 2017-10-30 DIAGNOSIS — M25571 Pain in right ankle and joints of right foot: Secondary | ICD-10-CM | POA: Diagnosis not present

## 2017-10-31 DIAGNOSIS — Z79899 Other long term (current) drug therapy: Secondary | ICD-10-CM | POA: Diagnosis not present

## 2017-10-31 DIAGNOSIS — L403 Pustulosis palmaris et plantaris: Secondary | ICD-10-CM | POA: Diagnosis not present

## 2017-10-31 DIAGNOSIS — L409 Psoriasis, unspecified: Secondary | ICD-10-CM | POA: Diagnosis not present

## 2017-11-19 ENCOUNTER — Other Ambulatory Visit: Payer: Self-pay | Admitting: Orthopedic Surgery

## 2017-11-20 ENCOUNTER — Other Ambulatory Visit: Payer: Self-pay | Admitting: Orthopedic Surgery

## 2017-11-20 DIAGNOSIS — R609 Edema, unspecified: Secondary | ICD-10-CM

## 2017-11-20 DIAGNOSIS — R52 Pain, unspecified: Secondary | ICD-10-CM

## 2017-11-25 ENCOUNTER — Ambulatory Visit
Admission: RE | Admit: 2017-11-25 | Discharge: 2017-11-25 | Disposition: A | Discharge status: Home / Self Care | Payer: 59 | Source: Ambulatory Visit | Attending: Orthopedic Surgery | Admitting: Orthopedic Surgery

## 2017-11-25 DIAGNOSIS — R52 Pain, unspecified: Secondary | ICD-10-CM

## 2017-11-25 DIAGNOSIS — R600 Localized edema: Secondary | ICD-10-CM | POA: Diagnosis not present

## 2017-11-25 DIAGNOSIS — R609 Edema, unspecified: Secondary | ICD-10-CM

## 2017-12-02 DIAGNOSIS — L409 Psoriasis, unspecified: Secondary | ICD-10-CM | POA: Diagnosis not present

## 2017-12-02 DIAGNOSIS — Z13820 Encounter for screening for osteoporosis: Secondary | ICD-10-CM | POA: Diagnosis not present

## 2017-12-02 DIAGNOSIS — L403 Pustulosis palmaris et plantaris: Secondary | ICD-10-CM | POA: Diagnosis not present

## 2017-12-02 DIAGNOSIS — Z79899 Other long term (current) drug therapy: Secondary | ICD-10-CM | POA: Diagnosis not present

## 2017-12-03 ENCOUNTER — Ambulatory Visit (INDEPENDENT_AMBULATORY_CARE_PROVIDER_SITE_OTHER): Payer: 59 | Admitting: Orthopedic Surgery

## 2017-12-03 ENCOUNTER — Encounter (INDEPENDENT_AMBULATORY_CARE_PROVIDER_SITE_OTHER): Payer: Self-pay | Admitting: Orthopedic Surgery

## 2017-12-03 DIAGNOSIS — M79671 Pain in right foot: Secondary | ICD-10-CM | POA: Diagnosis not present

## 2017-12-03 DIAGNOSIS — Q742 Other congenital malformations of lower limb(s), including pelvic girdle: Secondary | ICD-10-CM

## 2017-12-03 NOTE — Progress Notes (Signed)
Office Visit Note   Patient: Tammy King           Date of Birth: 1961-07-24           MRN: [id number] Visit Date: 12/03/2017              Requested by: Billie Ruddy, MD [address] [place], [place] [address] PCP: Billie Ruddy, MD  Chief Complaint  Patient presents with  . Left Foot - Pain, Injury      HPI: Patient is a 57 year old woman who feels she may have injured her right foot about 7 months ago.  She states she started to try to do some running.  She states she has seen Dr. Lorre Nick she was placed in a fracture boot and has been in this for the past 3-1/2 weeks.  Patient has initiated good conservative therapy.  Assessment & Plan: Visit Diagnoses:  1. Pain associated with accessory navicular bone of foot, right     Plan: Recommend patient get a stiff soled Trail running sneaker with sole orthotics and reevaluate in 2 months.  I feel this should resolve without surgical intervention.  Follow-Up Instructions: Return in about 2 months (around 02/02/2018).   Ortho Exam  Patient is alert, oriented, no adenopathy, well-dressed, normal affect, normal respiratory effort. Examination patient has an antalgic gait she has good pulses she has good ankle good subtalar motion.  She has no tenderness to palpation across the midfoot or forefoot.  The peroneal and posterior tibial tendons are nontender to palpation the Achilles tendon is nontender to palpation.  Patient is point tender to palpation over the accessory navicular she does have some motion at the fibrous union site of the accessory navicular and the navicular.  Review of the MRI scan shows edema at this location consistent with motion at the synchondrosis.  Imaging: No results found. No images are attached to the encounter.  Labs: Lab Results  Component Value Date   LABORGA NO GROWTH 06/09/2013    @LABSALLVALUES (HGBA1)@  There is no height or weight on file to calculate BMI.  Orders:  No orders of  the defined types were placed in this encounter.  No orders of the defined types were placed in this encounter.    Procedures: No procedures performed  Clinical Data: No additional findings.  ROS:  All other systems negative, except as noted in the HPI. Review of Systems  Objective: Vital Signs: LMP 08/16/2009   Specialty Comments:  No specialty comments available.  PMFS History: Patient Active Problem List   Diagnosis Date Noted  . Travel advice encounter 09/19/2013  . Ovarian benign neoplasm 11/29/2011  . 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 Allergy   . Anxiety   . Asthma   . Hyperlipidemia   . Hypertension   . 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 Sickle cell anemia (HCC)    trait  . Squamous cell hyperplasia of vulva     Family History  Problem Relation Age of Onset  . Diabetes Father   . Stroke Father   . Hypertension Maternal Grandmother   . Diabetes Mother   . Hypertension Maternal Grandfather   . Colon cancer Paternal Grandmother   . Hypertension Paternal Grandmother   . Heart attack Paternal Grandmother   . Hypertension Paternal Grandfather     Past Surgical History:  Procedure Laterality Date  . ABDOMINAL HYSTERECTOMY  08/16/2009   RSO  .  FOOT SURGERY Left 2008  . LAPAROSCOPIC HYSTERECTOMY  08/16/09   TLH with RSO   Social History   Occupational History  . Not on file  Tobacco Use  . Smoking status: Never Smoker  . Smokeless tobacco: Never Used  Substance and Sexual Activity  . Alcohol use: Yes    Alcohol/week: 2.4 oz    Types: 4 Glasses of wine per week  . Drug use: No  . Sexual activity: Yes    Partners: Male    Birth control/protection: Surgical    Comment: hysterectomy

## 2017-12-04 DIAGNOSIS — J301 Allergic rhinitis due to pollen: Secondary | ICD-10-CM | POA: Diagnosis not present

## 2017-12-04 DIAGNOSIS — J3081 Allergic rhinitis due to animal (cat) (dog) hair and dander: Secondary | ICD-10-CM | POA: Diagnosis not present

## 2017-12-04 DIAGNOSIS — J3089 Other allergic rhinitis: Secondary | ICD-10-CM | POA: Diagnosis not present

## 2017-12-17 ENCOUNTER — Other Ambulatory Visit: Payer: Self-pay | Admitting: *Deleted

## 2017-12-17 NOTE — Telephone Encounter (Signed)
Patient requesting refill of atorvastatin 20 mg. You have not filled this for patient before. Okay to refill?

## 2017-12-18 MED ORDER — ATORVASTATIN CALCIUM 20 MG PO TABS: 20.0000 mg | ORAL_TABLET | Freq: Every day | ORAL | 3 refills | Status: DC

## 2018-01-01 ENCOUNTER — Telehealth: Payer: Self-pay | Admitting: Family Medicine

## 2018-01-01 DIAGNOSIS — E876 Hypokalemia: Secondary | ICD-10-CM

## 2018-01-01 NOTE — Telephone Encounter (Signed)
Ok to refill Klor Con, however pt should have bmp drawn in the next few wks as I do not see a recent one.

## 2018-01-01 NOTE — Telephone Encounter (Signed)
Copied from [place] [id number]. Topic: Quick Communication - See Telephone Encounter >> Jan 01, 2018 11:30 AM Bea Graff, NT wrote: CRM for notification. See Telephone encounter for: 01/01/18. [facility] calling to get a verbal ok to refill the potassium. CB#: [phone]. Unable to send escribe.

## 2018-01-01 NOTE — Telephone Encounter (Signed)
Please advise refill request for Klor-Con. You have not filled for patient before.

## 2018-01-02 MED ORDER — POTASSIUM CHLORIDE CRYS ER 20 MEQ PO TBCR: 20.0000 meq | EXTENDED_RELEASE_TABLET | Freq: Every day | ORAL | 0 refills | Status: DC

## 2018-01-02 NOTE — Telephone Encounter (Signed)
Medication filled to pharmacy as requested. Called patient and left message to return call to schedule lab appt.

## 2018-01-02 NOTE — Telephone Encounter (Signed)
Patient is calling back and states she recently had labs done at her rheumatologist and she is going to get them to fax over the results.

## 2018-01-07 NOTE — Telephone Encounter (Signed)
I have not received fax results. Her rheum is in [place] though and last K was 3.5 in Aug. 2018. Would you still like her to have repeat labs done?

## 2018-01-14 NOTE — Telephone Encounter (Signed)
If pt is unable to find lab results, then yes the lab should be repeated as the results from care everywhere were done almost 1 yr ago.

## 2018-01-16 NOTE — Telephone Encounter (Signed)
Lab appt scheduled.

## 2018-01-17 ENCOUNTER — Other Ambulatory Visit (INDEPENDENT_AMBULATORY_CARE_PROVIDER_SITE_OTHER): Payer: 59

## 2018-01-17 DIAGNOSIS — E876 Hypokalemia: Secondary | ICD-10-CM

## 2018-01-17 LAB — BASIC METABOLIC PANEL
BUN: 9 mg/dL (ref 6–23)
CO2: 26 mEq/L (ref 19–32)
Calcium: 9.5 mg/dL (ref 8.4–10.5)
Chloride: 102 mEq/L (ref 96–112)
Creatinine, Ser: 0.85 mg/dL (ref 0.40–1.20)
GFR: 88.75 mL/min (ref >60.00)
Glucose, Bld: 119 mg/dL — ABNORMAL HIGH (ref 70–99)
POTASSIUM: 3.6 meq/L (ref 3.5–5.1)
SODIUM: 139 meq/L (ref 135–145)

## 2018-01-27 ENCOUNTER — Encounter (INDEPENDENT_AMBULATORY_CARE_PROVIDER_SITE_OTHER): Payer: Self-pay | Admitting: Orthopedic Surgery

## 2018-01-27 ENCOUNTER — Ambulatory Visit (INDEPENDENT_AMBULATORY_CARE_PROVIDER_SITE_OTHER): Payer: 59 | Admitting: Orthopedic Surgery

## 2018-01-27 VITALS — Ht 62.0 in | Wt 183.0 lb

## 2018-01-27 DIAGNOSIS — Q742 Other congenital malformations of lower limb(s), including pelvic girdle: Secondary | ICD-10-CM | POA: Diagnosis not present

## 2018-01-27 DIAGNOSIS — M79671 Pain in right foot: Secondary | ICD-10-CM

## 2018-01-27 NOTE — Progress Notes (Signed)
Office Visit Note   Patient: Tammy King           Date of Birth: [birth year]           MRN: [id number] Visit Date: 01/27/2018              Requested by: Billie Ruddy, MD [address] [place], [place] [address] PCP: Billie Ruddy, MD  Chief Complaint  Patient presents with  . Right Foot - Follow-up      HPI: Patient is a 57 year old woman with posterior tibial tendon insufficiency with a painful accessory navicular of the right foot.  Patient was initially placed in a fracture boot she is now wearing [place] sneakers with sole orthotics.  She feels like her symptoms have improved but she does have pain radiating up the posterior tibial tendon occasionally.  Assessment & Plan: Visit Diagnoses:  1. Pain associated with accessory navicular bone of foot, right     Plan: Recommend continue with conservative treatment with the [place] sneakers sole orthotics.  Discussed that she could resume cycling at a high cadence ideally around 90 with low resistance.  She will start this on her trainer she will use her cycling shoes which also provide good midfoot support.  Follow-Up Instructions: Return in about 2 months (around 03/29/2018).   Ortho Exam  Patient is alert, oriented, no adenopathy, well-dressed, normal affect, normal respiratory effort. Examination patient has no swelling ecchymosis or bruising in the foot or ankle.  The posterior tibial tendon is nontender to palpation the motion of the accessory navicular has decreased there is no swelling no tenderness to palpation.  Imaging: No results found. No images are attached to the encounter.  Labs: Lab Results  Component Value Date   LABORGA NO GROWTH 06/09/2013     Lab Results  Component Value Date   ALBUMIN 3.7 08/16/2009    Body mass index is 33.47 kg/m.  Orders:  No orders of the defined types were placed in this encounter.  No orders of the defined types were placed in this encounter.    Procedures: No procedures performed  Clinical Data: No additional findings.  ROS:  All other systems negative, except as noted in the HPI. Review of Systems  Objective: Vital Signs: Ht 5\' 2"  (1.575 m)   Wt 183 lb (83 kg)   LMP 08/16/2009   BMI 33.47 kg/m   Specialty Comments:  No specialty comments available.  PMFS History: Patient Active Problem List   Diagnosis Date Noted  . Travel advice encounter 09/19/2013  . Ovarian benign neoplasm 11/29/2011  . 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 Allergy   . Anxiety   . Asthma   . Hyperlipidemia   . Hypertension   . 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 Sickle cell anemia (HCC)    trait  . Squamous cell hyperplasia of vulva     Family History  Problem Relation Age of Onset  . Diabetes Father   . Stroke Father   . Hypertension Maternal Grandmother   . Diabetes Mother   . Hypertension Maternal Grandfather   . Colon cancer Paternal Grandmother   . Hypertension Paternal Grandmother   . Heart attack Paternal Grandmother   . Hypertension Paternal Grandfather     Past Surgical History:  Procedure Laterality Date  . ABDOMINAL HYSTERECTOMY  08/16/2009   RSO  . FOOT SURGERY Left 2008  . LAPAROSCOPIC HYSTERECTOMY  08/16/09   TLH with RSO   Social History   Occupational History  . Not on file  Tobacco Use  . Smoking status: Never Smoker  . Smokeless tobacco: Never Used  Substance and Sexual Activity  . Alcohol use: Yes    Alcohol/week: 2.4 oz    Types: 4 Glasses of wine per week  . Drug use: No  . Sexual activity: Yes    Partners: Male    Birth control/protection: Surgical    Comment: hysterectomy

## 2018-02-03 ENCOUNTER — Ambulatory Visit (INDEPENDENT_AMBULATORY_CARE_PROVIDER_SITE_OTHER): Payer: 59 | Admitting: Orthopedic Surgery

## 2018-03-31 ENCOUNTER — Ambulatory Visit (INDEPENDENT_AMBULATORY_CARE_PROVIDER_SITE_OTHER): Payer: 59 | Admitting: Orthopedic Surgery

## 2018-03-31 ENCOUNTER — Encounter (INDEPENDENT_AMBULATORY_CARE_PROVIDER_SITE_OTHER): Payer: Self-pay | Admitting: Orthopedic Surgery

## 2018-03-31 VITALS — Ht 64.0 in | Wt 179.0 lb

## 2018-03-31 DIAGNOSIS — M79671 Pain in right foot: Secondary | ICD-10-CM | POA: Diagnosis not present

## 2018-03-31 DIAGNOSIS — Q742 Other congenital malformations of lower limb(s), including pelvic girdle: Secondary | ICD-10-CM | POA: Diagnosis not present

## 2018-03-31 NOTE — Progress Notes (Signed)
Office Visit Note   Patient: Tammy King           Date of Birth: 07-02-1961           MRN: [id number] Visit Date: 03/31/2018              Requested by: Billie Ruddy, MD [address] [place], [place] [address] PCP: Billie Ruddy, MD  Chief Complaint  Patient presents with  . Right Foot - Follow-up      HPI: Patient is a 57 year old woman with posterior tibial tendon insufficiency on the right with a accessory navicular that has been painful.  Patient is currently in [place] sneakers with sole orthotics she has been doing stationary bicycling and she states she feels much better.  Assessment & Plan: Visit Diagnoses:  1. Pain associated with accessory navicular bone of foot, right     Plan: Patient will advance to road cycling with road cycling shoes.  She will continue with her low impact activities.  Follow-Up Instructions: Return if symptoms worsen or fail to improve.   Ortho Exam  Patient is alert, oriented, no adenopathy, well-dressed, normal affect, normal respiratory effort. Examination patient is a good dorsalis pedis pulse she can do a single limb heel raise without pain.  She has no tenderness palpation of the accessory navicular or over the posterior tibial tendon.  Imaging: No results found. No images are attached to the encounter.  Labs: Lab Results  Component Value Date   LABORGA NO GROWTH 06/09/2013     Lab Results  Component Value Date   ALBUMIN 3.7 08/16/2009    Body mass index is 30.73 kg/m.  Orders:  No orders of the defined types were placed in this encounter.  No orders of the defined types were placed in this encounter.    Procedures: No procedures performed  Clinical Data: No additional findings.  ROS:  All other systems negative, except as noted in the HPI. Review of Systems  Objective: Vital Signs: Ht 5\' 4"  (1.626 m)   Wt 179 lb (81.2 kg)   LMP 08/16/2009   BMI 30.73 kg/m   Specialty Comments:  No  specialty comments available.  PMFS History: Patient Active Problem List   Diagnosis Date Noted  . Travel advice encounter 09/19/2013  . Ovarian benign neoplasm 11/29/2011  . 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 Allergy   . Anxiety   . Asthma   . Hyperlipidemia   . Hypertension   . 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 Sickle cell anemia (HCC)    trait  . Squamous cell hyperplasia of vulva     Family History  Problem Relation Age of Onset  . Diabetes Father   . Stroke Father   . Hypertension Maternal Grandmother   . Diabetes Mother   . Hypertension Maternal Grandfather   . Colon cancer Paternal Grandmother   . Hypertension Paternal Grandmother   . Heart attack Paternal Grandmother   . Hypertension Paternal Grandfather     Past Surgical History:  Procedure Laterality Date  . ABDOMINAL HYSTERECTOMY  08/16/2009   RSO  . FOOT SURGERY Left 2008  . LAPAROSCOPIC HYSTERECTOMY  08/16/09   TLH with RSO   Social History   Occupational History  . Not on file  Tobacco Use  . Smoking status: Never Smoker  . Smokeless tobacco: Never Used  Substance and Sexual Activity  . Alcohol use: Yes  Alcohol/week: 4.0 standard drinks    Types: 4 Glasses of wine per week  . Drug use: No  . Sexual activity: Yes    Partners: Male    Birth control/protection: Surgical    Comment: hysterectomy

## 2018-04-05 ENCOUNTER — Other Ambulatory Visit: Payer: Self-pay | Admitting: Family Medicine

## 2018-04-08 DIAGNOSIS — H35033 Hypertensive retinopathy, bilateral: Secondary | ICD-10-CM | POA: Diagnosis not present

## 2018-06-04 DIAGNOSIS — Z1231 Encounter for screening mammogram for malignant neoplasm of breast: Secondary | ICD-10-CM | POA: Diagnosis not present

## 2018-06-05 DIAGNOSIS — L403 Pustulosis palmaris et plantaris: Secondary | ICD-10-CM | POA: Diagnosis not present

## 2018-06-05 DIAGNOSIS — M84374S Stress fracture, right foot, sequela: Secondary | ICD-10-CM | POA: Diagnosis not present

## 2018-06-05 DIAGNOSIS — Z79899 Other long term (current) drug therapy: Secondary | ICD-10-CM | POA: Diagnosis not present

## 2018-06-09 DIAGNOSIS — M8588 Other specified disorders of bone density and structure, other site: Secondary | ICD-10-CM | POA: Diagnosis not present

## 2018-07-29 DIAGNOSIS — Z8543 Personal history of malignant neoplasm of ovary: Secondary | ICD-10-CM | POA: Diagnosis not present

## 2018-07-29 DIAGNOSIS — Z6832 Body mass index (BMI) 32.0-32.9, adult: Secondary | ICD-10-CM | POA: Diagnosis not present

## 2018-07-29 DIAGNOSIS — Z01419 Encounter for gynecological examination (general) (routine) without abnormal findings: Secondary | ICD-10-CM | POA: Diagnosis not present

## 2018-08-21 IMAGING — MR MR ANKLE*R* W/O CM
5 series · 37 of 40 positions shown · non-contrast
Comparison: None.

CLINICAL DATA: Medial knee pain for 6 months

EXAM:
MRI OF THE RIGHT ANKLE WITHOUT CONTRAST
TECHNIQUE: Multiplanar, multisequence MR imaging of the ankle was performed. No
intravenous contrast was administered.

[Series 3: T2 fat-sat · axial · 3.0mm · 0.53mm/px · z∈[-49,+72]mm · 8 of 32 slices shown (1 of 3)]
[im 1/32]
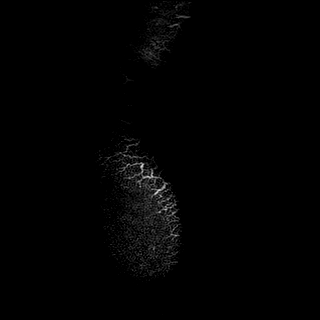
[im 4/32]
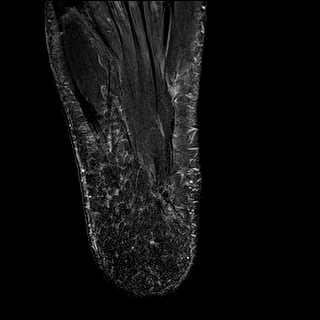
[im 11/32]
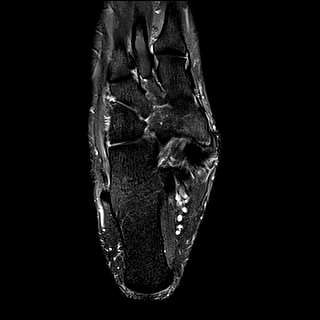
[im 14/32]
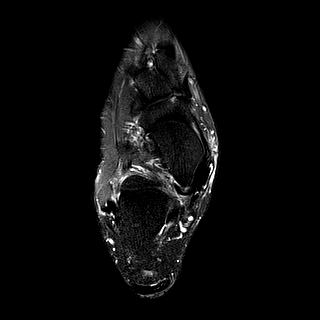
[im 18/32]
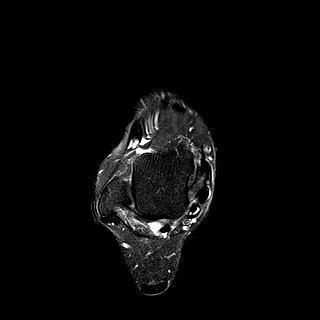
[im 21/32]
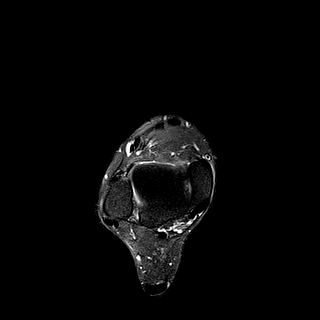
[im 28/32]
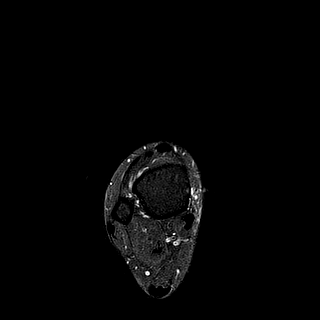
[im 32/32]
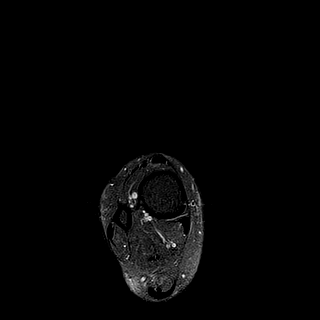

[Series 5: T1 · sagittal · 3.0mm · 0.40mm/px · 5 of 23 slices shown]
[im 1/23]
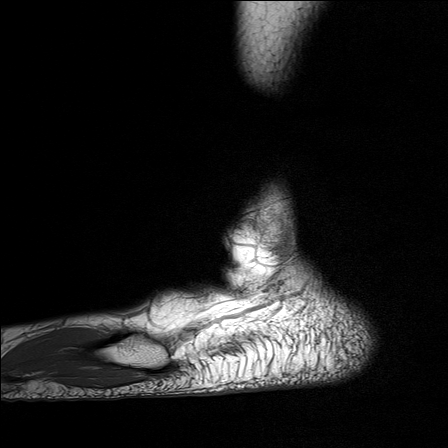
[im 5/23]
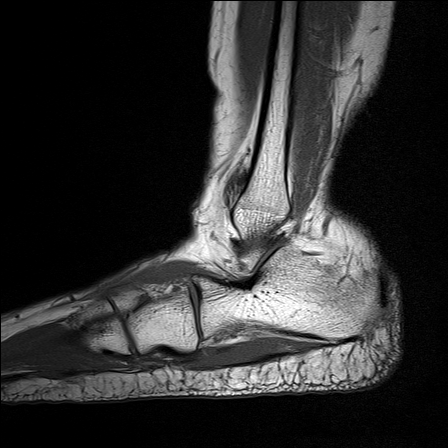
[im 9/23]
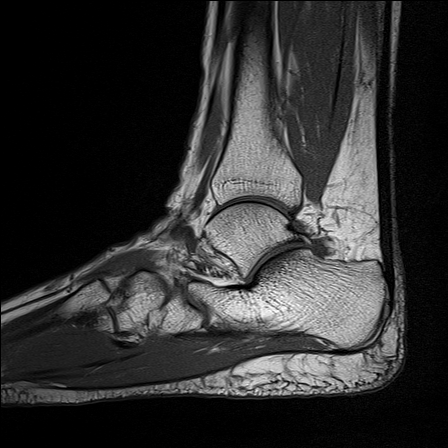
[im 14/23]
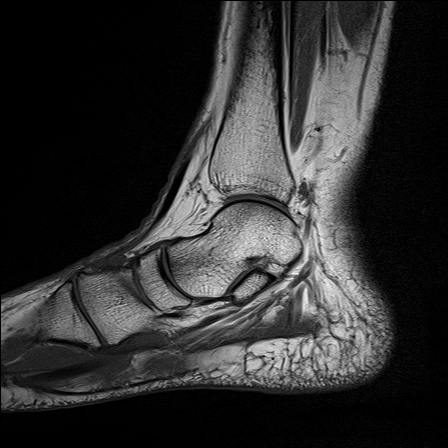
[im 18/23]
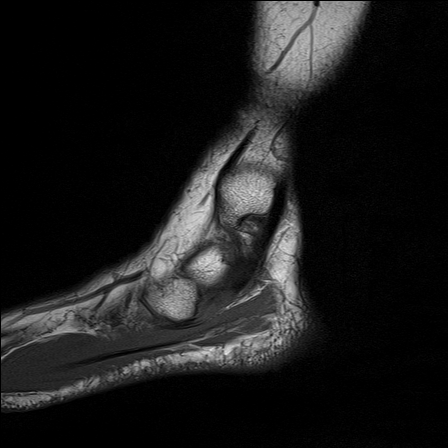

[Series 6: PD fat-sat · axial · 3.0mm · 0.44mm/px · z∈[-49,+72]mm · 9 of 32 slices shown]
[im 1/32]
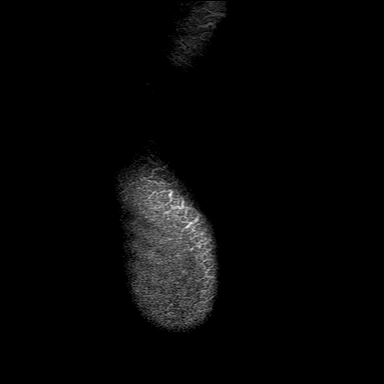
[im 4/32]
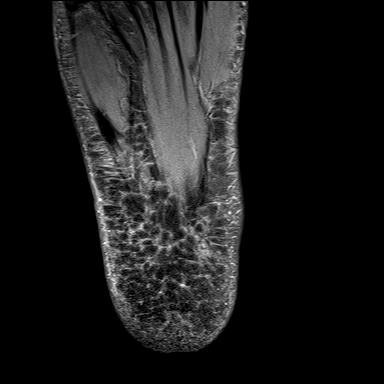
[im 8/32]
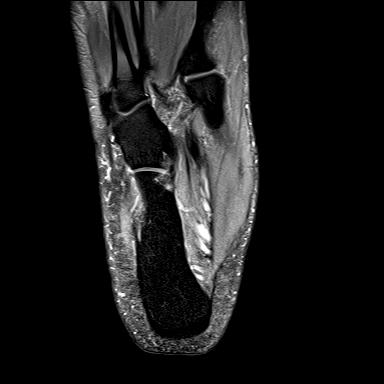
[im 12/32]
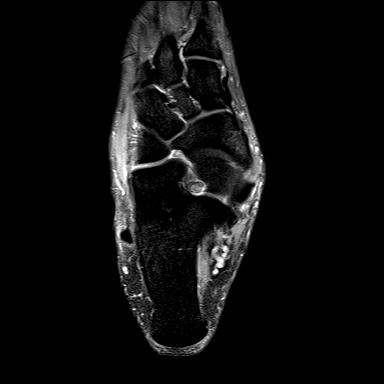
[im 16/32]
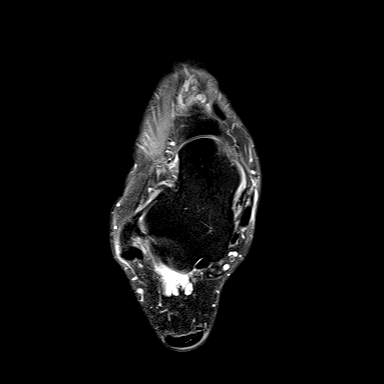
[im 20/32]
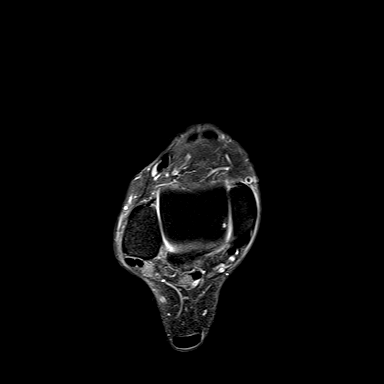
[im 24/32]
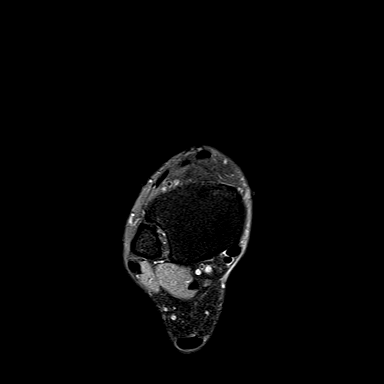
[im 28/32]
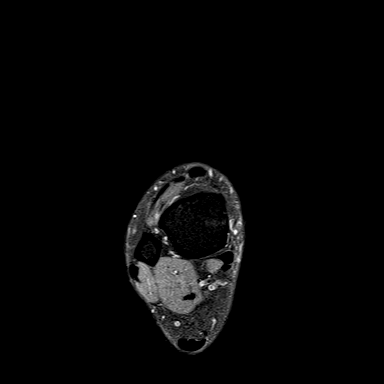
[im 32/32]
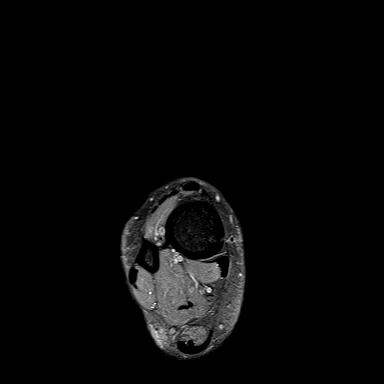

[Series 7: T2 fat-sat · coronal · 3.5mm · 0.47mm/px · 9 of 32 slices shown (2 of 3)]
[im 1/32]
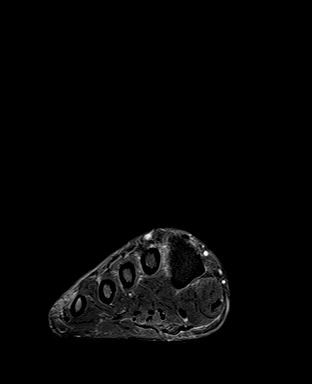
[im 4/32]
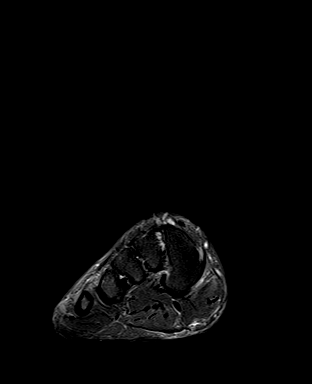
[im 8/32]
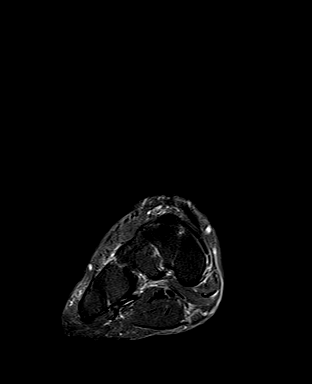
[im 12/32]
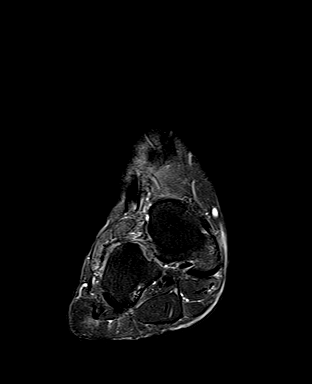
[im 16/32]
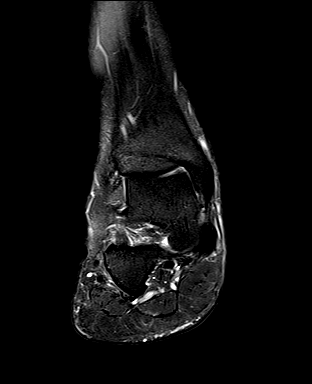
[im 20/32]
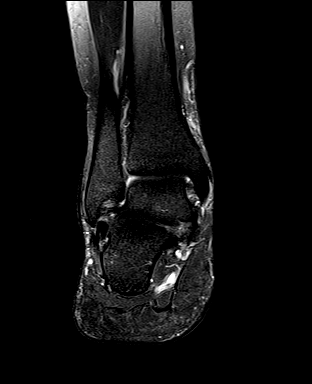
[im 24/32]
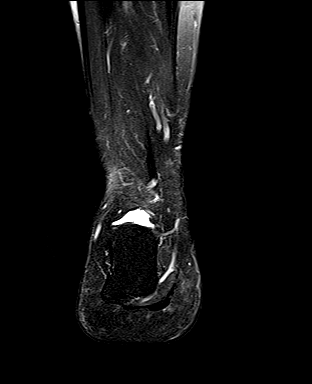
[im 28/32]
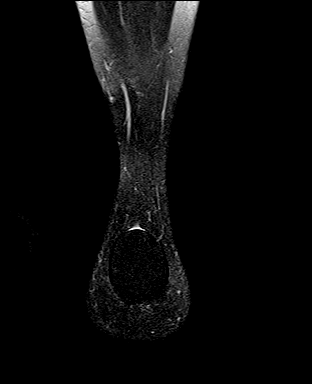
[im 32/32]
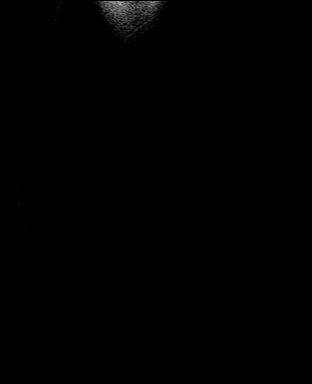

[Series 8: T2 fat-sat · sagittal · 3.0mm · 0.56mm/px · 6 of 23 slices shown (3 of 3)]
[im 1/23]
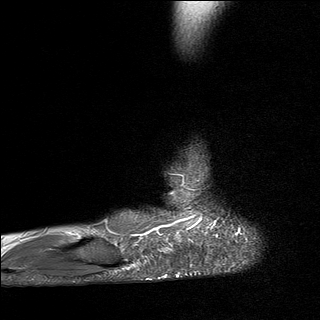
[im 5/23]
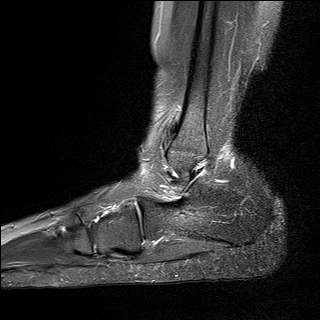
[im 9/23]
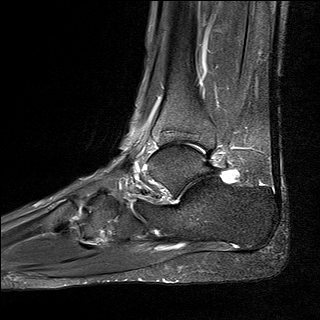
[im 14/23]
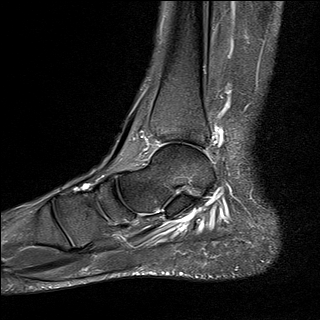
[im 18/23]
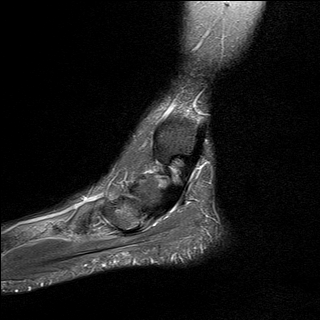
[im 23/23]
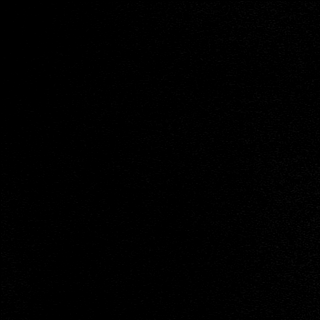

[37 of 40 positions shown; findings below may reference images not displayed]

FINDINGS: TENDONS

Peroneal: Peroneal longus tendon intact. Peroneal brevis intact.

Posteromedial: Posterior tibial tendon intact. Flexor hallucis
longus tendon intact. Flexor digitorum longus tendon intact.

Anterior: Tibialis anterior tendon intact. Extensor hallucis longus
tendon intact Extensor digitorum longus tendon intact.

Achilles:  Mild tendinosis of the Achilles tendon without a tear.

Plantar Fascia: Intact.

LIGAMENTS

Lateral: Anterior talofibular ligament intact. Calcaneofibular
ligament intact. Posterior talofibular ligament intact. Anterior and
posterior tibiofibular ligaments intact.

Medial: Deltoid ligament intact. Spring ligament intact.

CARTILAGE

Ankle Joint: No joint effusion. 4 mm osteochondral lesion involving
the medial corner of the talar dome with mild partial-thickness
overlying cartilage loss.

Subtalar Joints/Sinus Tarsi: Normal subtalar joints. Small subtalar
joint effusion. Normal sinus tarsi.

Bones: Marrow edema in the proximal medial aspect of the navicular
with a subtle linear signal abnormality which may reflect a
nondisplaced fracture versus an os naviculare with marrow edema on
either side of the synchondrosis as can be seen with painful os
naviculare syndrome.

Soft Tissue: No fluid collection or hematoma.
IMPRESSION: 1. Marrow edema in the proximal medial aspect of the navicular with
a subtle linear signal abnormality which may reflect a nondisplaced
fracture versus an os naviculare with marrow edema on either side of
the synchondrosis as can be seen with painful os naviculare
syndrome.
2. Mild tendinosis of the Achilles tendon.

## 2018-08-28 ENCOUNTER — Encounter: Payer: Self-pay | Admitting: Family Medicine

## 2018-08-28 ENCOUNTER — Ambulatory Visit: Payer: 59 | Admitting: Family Medicine

## 2018-08-28 VITALS — BP 102/70 | HR 68 | Temp 98.4°F | Wt 181.0 lb

## 2018-08-28 DIAGNOSIS — J4540 Moderate persistent asthma, uncomplicated: Secondary | ICD-10-CM | POA: Insufficient documentation

## 2018-08-28 MED ORDER — ALBUTEROL SULFATE HFA 108 (90 BASE) MCG/ACT IN AERS: 2.0000 | INHALATION_SPRAY | Freq: Four times a day (QID) | RESPIRATORY_TRACT | 6 refills | Status: AC | PRN

## 2018-08-28 MED ORDER — MONTELUKAST SODIUM 10 MG PO TABS: 10.0000 mg | ORAL_TABLET | Freq: Every day | ORAL | 3 refills | Status: AC

## 2018-08-28 MED ORDER — FLUTICASONE FUROATE-VILANTEROL 100-25 MCG/INH IN AEPB: 1.0000 | INHALATION_SPRAY | Freq: Every day | RESPIRATORY_TRACT | 5 refills | Status: AC

## 2018-08-28 MED ORDER — POTASSIUM CHLORIDE CRYS ER 20 MEQ PO TBCR: EXTENDED_RELEASE_TABLET | ORAL | 1 refills | Status: AC

## 2018-08-28 NOTE — Patient Instructions (Signed)
Asthma, Adult  Asthma is a long-term (chronic) condition that causes recurrent episodes in which the airways become tight and narrow. The airways are the passages that lead from the nose and mouth down into the lungs. Asthma episodes, also called asthma attacks, can cause coughing, wheezing, shortness of breath, and chest pain. The airways can also fill with mucus. During an attack, it can be difficult to breathe. Asthma attacks can range from minor to life threatening. Asthma cannot be cured, but medicines and lifestyle changes can help control it and treat acute attacks. What are the causes? This condition is believed to be caused by inherited (genetic) and environmental factors, but its exact cause is not known. There are many things that can bring on an asthma attack or make asthma symptoms worse (triggers). Asthma triggers are different for each person. Common triggers include:  Mold.  Dust.  Cigarette smoke.  Cockroaches.  Things that can cause allergy symptoms (allergens), such as animal dander or pollen from trees or grass.  Air pollutants such as household cleaners, wood smoke, smog, or [employer] account planner.  Cold air, weather changes, and winds (which increase molds and pollen in the air).  Strong emotional expressions such as crying or laughing hard.  Stress.  Certain medicines (such as aspirin) or types of medicines (such as beta-blockers).  Sulfites in foods and drinks. Foods and drinks that may contain sulfites include dried fruit, potato chips, and sparkling grape juice.  Infections or inflammatory conditions such as the flu, a cold, or inflammation of the nasal membranes (rhinitis).  Gastroesophageal reflux disease (GERD).  Exercise or strenuous activity. What are the signs or symptoms? Symptoms of this condition may occur right after asthma is triggered or many hours later. Symptoms include:  Wheezing. This can sound like whistling when you breathe.  Excessive  nighttime or early morning coughing.  Frequent or severe coughing with a common cold.  Chest tightness.  Shortness of breath.  Tiredness (fatigue) with minimal activity. How is this diagnosed? This condition is diagnosed based on:  Your medical history.  A physical exam.  Tests, which may include: ? Lung function studies and pulmonary studies (spirometry). These tests can evaluate the flow of air in your lungs. ? Allergy tests. ? Imaging tests, such as X-rays. How is this treated? There is no cure for this condition, but treatment can help control your symptoms. Treatment for asthma usually involves:  Identifying and avoiding your asthma triggers.  Using medicines to control your symptoms. Generally, two types of medicines are used to treat asthma: ? Controller medicines. These help prevent asthma symptoms from occurring. They are usually taken every day. ? Fast-acting reliever or rescue medicines. These quickly relieve asthma symptoms by widening the narrow and tight airways. They are used as needed and provide short-term relief.  Using supplemental oxygen. This may be needed during a severe episode.  Using other medicines, such as: ? Allergy medicines, such as antihistamines, if your asthma attacks are triggered by allergens. ? Immune medicines (immunomodulators). These are medicines that help control the immune system.  Creating an asthma action plan. An asthma action plan is a written plan for managing and treating your asthma attacks. This plan includes: ? A list of your asthma triggers and how to avoid them. ? Information about when medicines should be taken and when their dosage should be changed. ? Instructions about using a device called a peak flow meter. A peak flow meter measures how well the lungs are working and the  severity of your asthma. It helps you monitor your condition. Follow these instructions at home: Controlling your home environment Control your home  environment in the following ways to help avoid triggers and prevent asthma attacks:  Change your heating and air conditioning filter regularly.  Limit your use of fireplaces and wood stoves.  Get rid of pests (such as roaches and mice) and their droppings.  Throw away plants if you see mold on them.  Clean floors and dust surfaces regularly. Use unscented cleaning products.  Try to have someone else vacuum for you regularly. Stay out of rooms while they are being vacuumed and for a short while afterward. If you vacuum, use a dust mask from a hardware store, a double-layered or microfilter vacuum cleaner bag, or a vacuum cleaner with a HEPA filter.  Replace carpet with wood, tile, or vinyl flooring. Carpet can trap dander and dust.  Use allergy-proof pillows, mattress covers, and box spring covers.  Keep your bedroom a trigger-free room.  Avoid pets and keep windows closed when allergens are in the air.  Wash beddings every week in hot water and dry them in a dryer.  Use blankets that are made of polyester or cotton.  Clean bathrooms and kitchens with bleach. If possible, have someone repaint the walls in these rooms with mold-resistant paint. Stay out of the rooms that are being cleaned and painted.  Wash your hands often with soap and water. If soap and water are not available, use hand sanitizer.  Do not allow anyone to smoke in your home. General instructions  Take over-the-counter and prescription medicines only as told by your health care provider. ? Speak with your health care provider if you have questions about how or when to take the medicines. ? Make note if you are requiring more frequent dosages.  Do not use any products that contain nicotine or tobacco, such as cigarettes and e-cigarettes. If you need help quitting, ask your health care provider. Also, avoid being exposed to secondhand smoke.  Use a peak flow meter as told by your health care provider. Record and  keep track of the readings.  Understand and use the asthma action plan to help minimize, or stop an asthma attack, without needing to seek medical care.  Make sure you stay up to date on your yearly vaccinations as told by your health care provider. This may include vaccines for the flu and pneumonia.  Avoid outdoor activities when allergen counts are high and when air quality is low.  Wear a ski mask that covers your nose and mouth during outdoor winter activities. Exercise indoors on cold days if you can.  Warm up before exercising, and take time for a cool-down period after exercise.  Keep all follow-up visits as told by your health care provider. This is important. Where to find more information  For information about asthma, turn to the Centers for Disease Control and Prevention at www.cdc.gov/asthma/faqs.htm  For air quality information, turn to AirNow at https://airnow.gov/ Contact a health care provider if:  You have wheezing, shortness of breath, or a cough even while you are taking medicine to prevent attacks.  The mucus you cough up (sputum) is thicker than usual.  Your sputum changes from clear or white to yellow, green, gray, or bloody.  Your medicines are causing side effects, such as a rash, itching, swelling, or trouble breathing.  You need to use a reliever medicine more than 2-3 times a week.  Your peak flow reading   is still at 50-79% of your personal best after following your action plan for 1 hour.  You have a fever. Get help right away if:  You are getting worse and do not respond to treatment during an asthma attack.  You are short of breath when at rest or when doing very little physical activity.  You have difficulty eating, drinking, or talking.  You have chest pain or tightness.  You develop a fast heartbeat or palpitations.  You have a bluish color to your lips or fingernails.  You are light-headed or dizzy, or you faint.  Your peak flow  reading is less than 50% of your personal best.  You feel too tired to breathe normally. Summary  Asthma is a long-term (chronic) condition that causes recurrent episodes in which the airways become tight and narrow. These episodes can cause coughing, wheezing, shortness of breath, and chest pain.  Asthma cannot be cured, but medicines and lifestyle changes can help control it and treat acute attacks.  Make sure you understand how to avoid triggers and how and when to use your medicines.  Asthma attacks can range from minor to life threatening. Get help right away if you have an asthma attack and do not respond to treatment with your usual rescue medicines. This information is not intended to replace advice given to you by your health care provider. Make sure you discuss any questions you have with your health care provider. Document Released: 07/30/2005 Document Revised: 09/03/2016 Document Reviewed: 09/03/2016 Elsevier Interactive Patient Education  2019 [place], Adult An allergy is when your body's defense system (immune system) overreacts to an otherwise harmless substance (allergen) that you breathe in or eat or something that touches your skin. When you come into contact with something that you are allergic to, your immune system produces certain proteins (antibodies). These proteins cause cells to release chemicals (histamines) that trigger the symptoms of an allergic reaction. Allergies often affect the nasal passages (allergic rhinitis), eyes (allergic conjunctivitis), skin (atopic dermatitis), and stomach. Allergies can be mild or severe. Allergies cannot spread from person to person (are not contagious). They can develop at any age and may be outgrown. What increases the risk? You may be at greater risk of allergies if other people in your family have allergies. What are the signs or symptoms? Symptoms depend on what type of allergy you have. They may  include:  Runny, stuffy nose.  Sneezing.  Itchy mouth, ears, or throat.  Postnasal drip.  Sore throat.  Itchy, red, watery, or puffy eyes.  Skin rash or hives.  Stomach pain.  Vomiting.  Diarrhea.  Bloating.  Wheezing or coughing. People with a severe allergy to food, medicine, or an insect bite may have a life-threatening allergic reaction (anaphylaxis). Symptoms of anaphylaxis include:  Hives.  Itching.  Flushed face.  Swollen lips, tongue, or mouth.  Tight or swollen throat.  Chest pain or tightness in the chest.  Trouble breathing or shortness of breath.  Rapid heartbeat.  Dizziness or fainting.  Vomiting.  Diarrhea.  Pain in the abdomen. How is this diagnosed? This condition is diagnosed based on:  Your symptoms.  Your family and medical history.  A physical exam. You may need to see a health care provider who specializes in treating allergies (allergist). You may also have tests, including:  Skin tests to see which allergens are causing your symptoms, such as: ? Skin prick test. In this test, your skin is pricked with a tiny  needle and exposed to small amounts of possible allergens to see if your skin reacts. ? Intradermal skin test. In this test, a small amount of allergen is injected under your skin to see if your skin reacts. ? Patch test. In this test, a small amount of allergen is placed on your skin and then your skin is covered with a bandage. Your health care provider will check your skin after a couple of days to see if a rash has developed.  Blood tests.  Challenges tests. In this test, you inhale a small amount of allergen by mouth to see if you have an allergic reaction. You may also be asked to:  Keep a food diary. A food diary is a record of all the foods and drinks you have in a day and any symptoms you experience.  Practice an elimination diet. An elimination diet involves eliminating specific foods from your diet and then  adding them back in one by one to find out if a certain food causes an allergic reaction. How is this treated? Treatment for allergies depends on your symptoms. Treatment may include:  Cold compresses to soothe itching and swelling.  Eye drops.  Nasal sprays.  Using a saline spray or container (neti pot) to flush out the nose (nasal irrigation). These methods can help clear away mucus and keep the nasal passages moist.  Using a humidifier.  Oral antihistamines or other medicines to block allergic reaction and inflammation.  Skin creams to treat rashes or itching.  Diet changes to eliminate food allergy triggers.  Repeated exposure to tiny amounts of allergens to build up a tolerance and prevent future allergic reactions (immunotherapy). These include: ? Allergy shots. ? Oral treatment. This involves taking small doses of an allergen under the tongue (sublingual immunotherapy).  Emergency epinephrine injection (auto-injector) in case of an allergic emergency. This is a self-injectable, pre-measured medicine that must be given within the first few minutes of a serious allergic reaction. Follow these instructions at home:         Avoid known allergens whenever possible.  If you suffer from airborne allergens, wash out your nose daily. You can do this with a saline spray or a neti pot to flush out your nose (nasal irrigation).  Take over-the-counter and prescription medicines only as told by your health care provider.  Keep all follow-up visits as told by your health care provider. This is important.  If you are at risk of a severe allergic reaction (anaphylaxis), keep your auto-injector with you at all times.  If you have ever had anaphylaxis, wear a medical alert bracelet or necklace that states you have a severe allergy. Contact a health care provider if:  Your symptoms do not improve with treatment. Get help right away if:  You have symptoms of anaphylaxis, such  as: ? Swollen mouth, tongue, or throat. ? Pain or tightness in your chest. ? Trouble breathing or shortness of breath. ? Dizziness or fainting. ? Severe abdominal pain, vomiting, or diarrhea. This information is not intended to replace advice given to you by your health care provider. Make sure you discuss any questions you have with your health care provider. Document Released: 10/23/2002 Document Revised: 11/28/2016 Document Reviewed: 02/15/2016 Elsevier Interactive Patient Education  2019 [employer].

## 2018-08-28 NOTE — Progress Notes (Signed)
Subjective:    Patient ID: Tammy King, female    DOB: 1960-10-20, 58 y.o.   MRN: [id number]  No chief complaint on file.   HPI Patient was seen today for follow-up on asthma.  Pt endorses increased symptoms including wheezing, coughing, shortness of breath x1 month.  Pt previously on Breo and Singulair, using rescue inhaler.  Endorses symptoms starting after cleaning her house.  States she quit her job at [employer] form and accepted a position in [place].  Pt is commuting, but plans to sell her house.  Pt was receiving allergy shots.  Past Medical History:  Diagnosis Date  . Allergy   . Anxiety   . Asthma   . Hyperlipidemia   . Hypertension   . 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 Sickle cell anemia (HCC)    trait  . Squamous cell hyperplasia of vulva     Allergies  Allergen Reactions  . Cat Hair Extract Itching    Can't be around cats.  . Mold Extract  [Trichophyton] Other (See Comments)    As an asthmatic reaction  . Other Itching, Shortness Of Breath and Other (See Comments)    Can be around dogs because of allergy shots. [place]ona[place]- itching Peanut butter- itching *Patient stills eats these foods.    ROS General: Denies fever, chills, night sweats, changes in weight, changes in appetite HEENT: Denies headaches, ear pain, changes in vision, rhinorrhea, sore throat CV: Denies CP, palpitations, SOB, orthopnea Pulm:   +SOB, cough, wheezing GI: Denies abdominal pain, nausea, vomiting, diarrhea, constipation GU: Denies dysuria, hematuria, frequency, vaginal discharge Msk: Denies muscle cramps, joint pains Neuro: Denies weakness, numbness, tingling Skin: Denies rashes, bruising Psych: Denies depression, anxiety, hallucinations    Objective:    Blood pressure 102/70, pulse 68, temperature 98.4 F (36.9 C), temperature source Oral, weight 181 lb (82.1 kg), last menstrual period 08/16/2009, SpO2 98 %.  Gen. Pleasant, well-nourished, in no  distress, normal affect   HEENT: [place]/AT, face symmetric, no scleral icterus, nares patent without drainage Lungs: no accessory muscle use, CTAB, no wheezes or rales Cardiovascular: RRR, no m/r/g, no peripheral edema Neuro:  A&Ox3, CN II-XII intact, normal gait Skin:  Warm, no lesions/ rash   Wt Readings from Last 3 Encounters:  03/31/18 179 lb (81.2 kg)  01/27/18 183 lb (83 kg)  09/13/17 183 lb 14.4 oz (83.4 kg)    Lab Results  Component Value Date   WBC 5.4 08/16/2009   HGB 12.4 08/16/2009   HCT 37.5 08/16/2009   PLT 263 08/16/2009   GLUCOSE 119 (H) 01/17/2018   ALT 16 08/16/2009   AST 24 08/16/2009   NA 139 01/17/2018   K 3.6 01/17/2018   CL 102 01/17/2018   CREATININE 0.85 01/17/2018   BUN 9 01/17/2018   CO2 26 01/17/2018    Assessment/Plan:  Moderate persistent asthma without complication  -discussed restarting meds -given handout - Plan: fluticasone furoate-vilanterol (BREO ELLIPTA) 100-25 MCG/INH AEPB, montelukast (SINGULAIR) 10 MG tablet, albuterol (PROVENTIL HFA;VENTOLIN HFA) 108 (90 Base) MCG/ACT inhaler  F/u prn  Grier Mitts, MD

## 2018-09-23 ENCOUNTER — Other Ambulatory Visit: Payer: Self-pay | Admitting: Family Medicine

## 2018-09-23 DIAGNOSIS — I10 Essential (primary) hypertension: Secondary | ICD-10-CM

## 2018-12-22 ENCOUNTER — Other Ambulatory Visit: Payer: Self-pay | Admitting: Family Medicine

## 2019-01-11 ENCOUNTER — Other Ambulatory Visit: Payer: Self-pay | Admitting: Family Medicine

## 2019-01-11 DIAGNOSIS — I10 Essential (primary) hypertension: Secondary | ICD-10-CM

## 2021-10-11 ENCOUNTER — Encounter: Payer: Self-pay | Admitting: Internal Medicine
